# Patient Record
Sex: Male | Born: 1955 | Race: White | Hispanic: No | Marital: Married | Smoking: Former smoker
Health system: Southern US, Community
[De-identification: ages and names within clinical notes are randomized; demographics above are authoritative.]

## PROBLEM LIST (undated history)

## (undated) DIAGNOSIS — I82409 Acute embolism and thrombosis of unspecified deep veins of unspecified lower extremity: Secondary | ICD-10-CM

## (undated) DIAGNOSIS — I499 Cardiac arrhythmia, unspecified: Secondary | ICD-10-CM

## (undated) DIAGNOSIS — R002 Palpitations: Secondary | ICD-10-CM

## (undated) DIAGNOSIS — I10 Essential (primary) hypertension: Secondary | ICD-10-CM

## (undated) HISTORY — DX: Palpitations: R00.2

## (undated) HISTORY — DX: Essential (primary) hypertension: I10

## (undated) HISTORY — PX: NO PAST SURGERIES: SHX2092

## (undated) HISTORY — PX: OTHER SURGICAL HISTORY: SHX169

## (undated) HISTORY — PX: INNER EAR SURGERY: SHX679

---

## 1983-07-09 DIAGNOSIS — Z86718 Personal history of other venous thrombosis and embolism: Secondary | ICD-10-CM

## 1983-07-09 HISTORY — DX: Personal history of other venous thrombosis and embolism: Z86.718

## 2016-07-05 ENCOUNTER — Encounter: Payer: Self-pay | Admitting: *Deleted

## 2016-07-09 ENCOUNTER — Ambulatory Visit (INDEPENDENT_AMBULATORY_CARE_PROVIDER_SITE_OTHER): Payer: BC Managed Care – PPO | Admitting: Cardiovascular Disease

## 2016-07-09 ENCOUNTER — Encounter: Payer: Self-pay | Admitting: Cardiovascular Disease

## 2016-07-09 VITALS — BP 138/100 | HR 76 | Ht 72.0 in | Wt 237.0 lb

## 2016-07-09 DIAGNOSIS — F1010 Alcohol abuse, uncomplicated: Secondary | ICD-10-CM | POA: Diagnosis not present

## 2016-07-09 DIAGNOSIS — R002 Palpitations: Secondary | ICD-10-CM

## 2016-07-09 DIAGNOSIS — I10 Essential (primary) hypertension: Secondary | ICD-10-CM | POA: Diagnosis not present

## 2016-07-09 NOTE — Patient Instructions (Signed)
Medication Instructions:  Continue all current medications.  Labwork: none  Testing/Procedures:  Your physician has requested that you have an echocardiogram. Echocardiography is a painless test that uses sound waves to create images of your heart. It provides your doctor with information about the size and shape of your heart and how well your heart's chambers and valves are working. This procedure takes approximately one hour. There are no restrictions for this procedure.  Your physician has recommended that you wear a 30 day event monitor. Event monitors are medical devices that record the heart's electrical activity. Doctors most often us these monitors to diagnose arrhythmias. Arrhythmias are problems with the speed or rhythm of the heartbeat. The monitor is a small, portable device. You can wear one while you do your normal daily activities. This is usually used to diagnose what is causing palpitations/syncope (passing out).  Office will contact with results via phone or letter.    Follow-Up: 2 months   Any Other Special Instructions Will Be Listed Below (If Applicable).  If you need a refill on your cardiac medications before your next appointment, please call your pharmacy.  

## 2016-07-09 NOTE — Progress Notes (Signed)
CARDIOLOGY CONSULT NOTE  Patient ID: Jeffery Chambers MRN: [id number] DOB/AGE: [birth year] 61 y.o.  Admit date: (Not on file) Primary Physician: Jeffery Flavin, MD Referring Physician:   Reason for Consultation: palpitations  HPI: The patient is a 61 year old male who presents for the evaluation of palpitations. He has a history of hypertension.  ECG 04/30/16 showed normal sinus rhythm with no arrhythmias.  He began experiencing a tachycardia 2 years ago and said his blood pressure monitor at home showed that his heart rate was over 100 bpm. He then experienced heart racing approximately 18 months ago and was evaluated by a retired physician who asked about "an irregular heartbeat ". It lasted several hours. He may go one or two months without any problems and then will experience episodes lasting anywhere from 15 minutes to 5 or 6 hours. If he goes to bed with them when he awakes he is fine.  He denies associated shortness of breath, chest pain, lightheadedness, dizziness, and syncope. They are not associated with exertion.  Social history: He drinks 3-5 alcoholic beverages daily including vodka, bourbon, and beer.   Allergies  Allergen Reactions  . Codeine     Nausea     Current Outpatient Prescriptions  Medication Sig Dispense Refill  . Ascorbic Acid (VITAMIN C) 1000 MG tablet Take 1,000 mg by mouth daily.    Marland Kitchen lisinopril (PRINIVIL,ZESTRIL) 40 MG tablet Take 40 mg by mouth daily.    . Omega-3 Fatty Acids (FISH OIL) 1200 MG CAPS Take by mouth.    Marland Kitchen omeprazole (PRILOSEC OTC) 20 MG tablet Take 20 mg by mouth daily.    Marland Kitchen warfarin (COUMADIN) 10 MG tablet Take 10 mg by mouth every other day.     No current facility-administered medications for this visit.     Past Medical History:  Diagnosis Date  . Hypertension   . Palpitations     Past Surgical History:  Procedure Laterality Date  . NO PAST SURGERIES      Social History   Social History  . Marital status:  Married    Spouse name: N/A  . Number of children: N/A  . Years of education: N/A   Occupational History  . Not on file.   Social History Main Topics  . Smoking status: Former Smoker    Types: E-cigarettes    Quit date: 07/08/2008  . Smokeless tobacco: Never Used     Comment: vap   . Alcohol use Not on file  . Drug use: Unknown  . Sexual activity: Not on file   Other Topics Concern  . Not on file   Social History Narrative  . No narrative on file     Fam: Brother has an arrhythmia.  Prior to Admission medications   Medication Sig Start Date End Date Taking? Authorizing Provider  lisinopril (PRINIVIL,ZESTRIL) 40 MG tablet Take 40 mg by mouth daily.    Historical Provider, MD  omeprazole (PRILOSEC OTC) 20 MG tablet Take 20 mg by mouth daily.    Historical Provider, MD  warfarin (COUMADIN) 10 MG tablet Take 10 mg by mouth every other day.    Historical Provider, MD     Review of systems complete and found to be negative unless listed above in HPI     Physical exam Blood pressure (!) 138/100, pulse 76, height 6' (1.829 m), weight 237 lb (107.5 kg), SpO2 99 %. General: NAD Neck: No JVD, no thyromegaly or thyroid nodule.  Lungs: Clear to auscultation  bilaterally with normal respiratory effort. CV: Nondisplaced PMI. Regular rate and rhythm, normal S1/S2, no S3/S4, no murmur.  No peripheral edema.  No carotid bruit.  Abdomen: Obese.  Skin: Intact without lesions or rashes.  Neurologic: Alert and oriented x 3.  Psych: Normal affect. Extremities: No clubbing or cyanosis.  HEENT: Normal.   ECG: Most recent ECG reviewed.  Labs:  No results found for: WBC, HGB, HCT, MCV, PLT No results for input(s): NA, K, CL, CO2, BUN, CREATININE, CALCIUM, PROT, BILITOT, ALKPHOS, ALT, AST, GLUCOSE in the last 168 hours.  Invalid input(s): LABALBU No results found for: CKTOTAL, CKMB, CKMBINDEX, TROPONINI No results found for: CHOL No results found for: HDL No results found for:  LDLCALC No results found for: TRIG No results found for: CHOLHDL No results found for: LDLDIRECT       Studies: No results found.  ASSESSMENT AND PLAN:  1. Palpitations: I will order a 2-D echocardiogram with Doppler to evaluate cardiac structure, function, and regional wall motion. Will also obtain a 30-day event monitor as I am concerned about atrial fibrillation. He is already on warfarin.  2. Hypertension: Elevated. Will monitor.  3. Alcohol abuse: Counseled to reduce consumption as it can provoke both arrhythmias and lead to cardiac toxicity.  Dispo: fu 2 months   Signed: Prentice DockerSuresh Maelie Chambers, M.D., F.A.C.C.  07/09/2016, 1:46 PM

## 2016-07-10 ENCOUNTER — Other Ambulatory Visit: Payer: Self-pay | Admitting: Cardiovascular Disease

## 2016-07-10 DIAGNOSIS — R000 Tachycardia, unspecified: Secondary | ICD-10-CM

## 2016-07-16 ENCOUNTER — Ambulatory Visit (INDEPENDENT_AMBULATORY_CARE_PROVIDER_SITE_OTHER): Payer: BC Managed Care – PPO

## 2016-07-16 DIAGNOSIS — R002 Palpitations: Secondary | ICD-10-CM | POA: Diagnosis not present

## 2016-07-18 ENCOUNTER — Telehealth: Payer: Self-pay | Admitting: *Deleted

## 2016-07-18 NOTE — Telephone Encounter (Signed)
[facility] called says 160-180 HR - shows a-fib with RVR - 6 beat run of vtach - faxing over report to be scanned in - spoke with pt who denies amy symptoms or problems - says he did get excited/anxious for a breif time but denies any symptoms at this time - routed to provider

## 2016-07-18 NOTE — Telephone Encounter (Signed)
Will review rhythm strips and decide on management.

## 2016-07-22 MED ORDER — METOPROLOL SUCCINATE ER 25 MG PO TB24: 25.0000 mg | ORAL_TABLET | Freq: Every day | ORAL | 3 refills | Status: DC

## 2016-07-22 NOTE — Telephone Encounter (Signed)
Start Toprol XL 25 mg daily ? ? ?

## 2016-07-22 NOTE — Telephone Encounter (Signed)
Pt aware and voiced understanding - Medication sent to pharmacy.  

## 2016-08-01 ENCOUNTER — Ambulatory Visit (INDEPENDENT_AMBULATORY_CARE_PROVIDER_SITE_OTHER): Payer: BC Managed Care – PPO

## 2016-08-01 ENCOUNTER — Other Ambulatory Visit: Payer: Self-pay

## 2016-08-01 DIAGNOSIS — R000 Tachycardia, unspecified: Secondary | ICD-10-CM

## 2016-08-03 ENCOUNTER — Telehealth: Payer: Self-pay | Admitting: Physician Assistant

## 2016-08-03 NOTE — Telephone Encounter (Signed)
Paged by Forde RadonAJ from [facility] [phone] regarding rapid afib that started around 4:30. [facility] recommended patient to go to ED, he did not wish to according to AJ. I have called the patient, he is asymptomatic. No dizziness or feeling of passing out. I have instructed him and his wife to cut back lisinopril to 20mg  while increasing toprol to 25mg  BID. He will take 1 tablet now and recheck his HR in 2 hours. If he can maintain resting HR < 140 and stay asymptomatic, we can try to manage this as outpatient. If her HR still above 140 bpm in 2 hours, patient will contact after hour answering service again.  Ramond DialSigned, Neilani Duffee PA Pager: [phone]

## 2016-08-06 ENCOUNTER — Telehealth: Payer: Self-pay | Admitting: *Deleted

## 2016-08-06 NOTE — Telephone Encounter (Signed)
Notes Recorded by Lesle ChrisAngela G Mckena Chern, LPN on 9/56/21301/30/2018 at 5:32 PM EST Patient notified and verbalized understanding. Copy to pmd. ------  Notes Recorded by Laqueta LindenSuresh A Koneswaran, MD on 08/04/2016 at 10:47 PM EST Normal pumping function.

## 2016-08-13 ENCOUNTER — Ambulatory Visit (INDEPENDENT_AMBULATORY_CARE_PROVIDER_SITE_OTHER): Payer: BC Managed Care – PPO | Admitting: Cardiovascular Disease

## 2016-08-13 ENCOUNTER — Encounter: Payer: Self-pay | Admitting: Cardiovascular Disease

## 2016-08-13 VITALS — BP 148/100 | HR 68 | Ht 72.0 in | Wt 239.0 lb

## 2016-08-13 DIAGNOSIS — I4891 Unspecified atrial fibrillation: Secondary | ICD-10-CM | POA: Diagnosis not present

## 2016-08-13 DIAGNOSIS — I10 Essential (primary) hypertension: Secondary | ICD-10-CM

## 2016-08-13 DIAGNOSIS — F1010 Alcohol abuse, uncomplicated: Secondary | ICD-10-CM

## 2016-08-13 DIAGNOSIS — Z7189 Other specified counseling: Secondary | ICD-10-CM

## 2016-08-13 DIAGNOSIS — I82401 Acute embolism and thrombosis of unspecified deep veins of right lower extremity: Secondary | ICD-10-CM | POA: Diagnosis not present

## 2016-08-13 MED ORDER — LISINOPRIL 20 MG PO TABS: 20.0000 mg | ORAL_TABLET | Freq: Every day | ORAL | 3 refills | Status: DC

## 2016-08-13 MED ORDER — METOPROLOL SUCCINATE ER 25 MG PO TB24: 25.0000 mg | ORAL_TABLET | Freq: Two times a day (BID) | ORAL | 3 refills | Status: DC

## 2016-08-13 NOTE — Patient Instructions (Signed)
Medication Instructions:  Continue all current medications.  Labwork: none  Testing/Procedures: none  Follow-Up: 2 months   Any Other Special Instructions Will Be Listed Below (If Applicable).  If you need a refill on your cardiac medications before your next appointment, please call your pharmacy.  

## 2016-08-13 NOTE — Progress Notes (Signed)
SUBJECTIVE: The patient presents for follow-up of palpitations. He is wearing an event monitor which has demonstrated rapid atrial fibrillation with heart rates ranging from 160-180 bpm. He also had a 6 beat run of ventricular tachycardia. He tells me he was digging a 100 foot ditch which was 1 foot deep at the time. He was also Programme researcher, broadcasting/film/video wiring. He appears to get palpitations and rapid atrial fibrillation during periods of heavy exertion. He has run out of his metoprolol. It was increased from Toprol-XL 25 mg once daily to twice daily. 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  Echocardiogram 08/01/16: Normal left ventricular systolic function and regional wall motion, LVEF 60-65%. Mild mitral and tricuspid regurgitation. Left atrium at upper limits of normal in size.   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 Has several grandsons who play sports. He drinks 3-5 alcoholic beverages daily including vodka, bourbon, and beer.  Review of Systems: As per "subjective", otherwise negative.  Allergies  Allergen Reactions  . Codeine     Nausea     Current Outpatient Prescriptions  Medication Sig Dispense Refill  . Ascorbic Acid (VITAMIN C) 1000 MG tablet Take 1,000 mg by mouth daily.    Marland Kitchen lisinopril (PRINIVIL,ZESTRIL) 20 MG tablet Take 20 mg by mouth daily.    . metoprolol succinate (TOPROL-XL) 25 MG 24 hr tablet Take 25 mg by mouth 2 (two) times daily.    . Omega-3 Fatty Acids (FISH OIL) 1200 MG CAPS Take by mouth.    Marland Kitchen omeprazole (PRILOSEC OTC) 20 MG tablet Take 20 mg by mouth daily.    Marland Kitchen warfarin (COUMADIN) 10 MG tablet Take 10 mg by mouth every other day.     No current facility-administered medications for this visit.     Past Medical History:  Diagnosis Date  . Hypertension   . Palpitations     Past Surgical History:  Procedure Laterality  Date  . NO PAST SURGERIES      Social History   Social History  . Marital status: Married    Spouse name: N/A  . Number of children: N/A  . Years of education: N/A   Occupational History  . Not on file.   Social History Main Topics  . Smoking status: Former Smoker    Types: E-cigarettes    Quit date: 07/08/2008  . Smokeless tobacco: Never Used     Comment: vap   . Alcohol use Not on file  . Drug use: Unknown  . Sexual activity: Not on file   Other Topics Concern  . Not on file   Social History Narrative  . No narrative on file     Vitals:   08/13/16 1549  BP: (!) 148/100  Pulse: 68  SpO2: 98%  Weight: 239 lb (108.4 kg)  Height: 6' (1.829 m)    PHYSICAL EXAM General: NAD HEENT: Normal. Neck: No JVD, no thyromegaly. Lungs: Clear to auscultation bilaterally with normal respiratory effort. CV: Nondisplaced PMI.  Regular rate and rhythm, normal S1/S2, no S3/S4, no murmur. No pretibial or periankle edema.  No carotid bruit.   Abdomen: Soft, nontender, no distention.  Neurologic: Alert and oriented.  Psych: Normal affect. Skin: Normal. Musculoskeletal: No gross deformities.    ECG: Most recent ECG reviewed.      ASSESSMENT AND PLAN: 1. Paroxysmal atrial fibrillation: Continue Toprol-XL 25 mg twice  daily. This has appeared to suppress frequency of palpitations. 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 We discussed the option of taking Xarelto instead and he has had this discussion before with his PCP. For the present time, he prefers to stay on warfarin. Left atrium at upper limits of normal in size. Aim to control blood pressure. I encouraged alcohol consumption reduction as well.  2. Hypertension: Elevated but he has run out of metoprolol succinate. I have asked him to continue to monitor blood pressure to see if lisinopril dose has to be  increased back to 40 mg daily.  3. Alcohol abuse: Counseled to reduce consumption as it can provoke both arrhythmias and lead to cardiac toxicity.  4. 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 PCP monitors INR. We discussed the option of taking Xarelto instead and he has had this discussion before with his PCP. For the present time, he prefers to stay on warfarin.   Dispo: fu 2 months  Time spent: 40 minutes, of which greater than 50% was spent reviewing symptoms, relevant blood tests and studies, and discussing management plan with the patient.   Prentice DockerSuresh Koneswaran, M.D., F.A.C.C.

## 2016-08-22 ENCOUNTER — Telehealth: Payer: Self-pay | Admitting: *Deleted

## 2016-08-22 ENCOUNTER — Encounter: Payer: Self-pay | Admitting: *Deleted

## 2016-08-22 DIAGNOSIS — I472 Ventricular tachycardia: Secondary | ICD-10-CM

## 2016-08-22 DIAGNOSIS — I4729 Other ventricular tachycardia: Secondary | ICD-10-CM

## 2016-08-22 NOTE — Telephone Encounter (Signed)
Notes Recorded by Lesle ChrisAngela G Lillyen Schow, LPN on 1/61/09602/15/2018 at 5:12 PM EST Patient notified & agrees to do [place]. Copy to pmd. Will put order in & [facility]. ------  Notes Recorded by Laqueta LindenSuresh A Koneswaran, MD on 08/22/2016 at 12:37 PM EST Given multiple runs of NSVT, please obtain Lexiscan to rule out ischemic heart disease.

## 2016-09-02 ENCOUNTER — Encounter (HOSPITAL_COMMUNITY): Payer: Self-pay

## 2016-09-02 ENCOUNTER — Inpatient Hospital Stay (HOSPITAL_COMMUNITY): Admission: RE | Admit: 2016-09-02 | Payer: BC Managed Care – PPO | Source: Ambulatory Visit

## 2016-09-02 ENCOUNTER — Encounter (HOSPITAL_COMMUNITY)
Admission: RE | Admit: 2016-09-02 | Discharge: 2016-09-02 | Disposition: A | Discharge status: Home / Self Care | Payer: BC Managed Care – PPO | Source: Ambulatory Visit | Attending: Cardiovascular Disease | Admitting: Cardiovascular Disease

## 2016-09-02 ENCOUNTER — Telehealth: Payer: Self-pay | Admitting: *Deleted

## 2016-09-02 DIAGNOSIS — I472 Ventricular tachycardia: Secondary | ICD-10-CM | POA: Insufficient documentation

## 2016-09-02 DIAGNOSIS — I4729 Other ventricular tachycardia: Secondary | ICD-10-CM

## 2016-09-02 LAB — NM MYOCAR MULTI W/SPECT W/WALL MOTION / EF
CHL CUP RESTING HR STRESS: 60 {beats}/min
LV sys vol: 41 mL
LVDIAVOL: 113 mL (ref 62–150)
NUC STRESS TID: 1
Peak HR: 105 {beats}/min
RATE: 0.42
SDS: 1
SRS: 9
SSS: 10

## 2016-09-02 MED ORDER — TECHNETIUM TC 99M TETROFOSMIN IV KIT
10.0000 | PACK | Freq: Once | INTRAVENOUS | Status: AC | PRN
  Administered 2016-09-02: 11 via INTRAVENOUS

## 2016-09-02 MED ORDER — TECHNETIUM TC 99M TETROFOSMIN IV KIT
30.0000 | PACK | Freq: Once | INTRAVENOUS | Status: AC | PRN
  Administered 2016-09-02: 30.5 via INTRAVENOUS

## 2016-09-02 MED ORDER — SODIUM CHLORIDE 0.9% FLUSH
INTRAVENOUS | Status: AC
  Administered 2016-09-02: 10 mL via INTRAVENOUS
  Filled 2016-09-02: qty 10

## 2016-09-02 MED ORDER — REGADENOSON 0.4 MG/5ML IV SOLN
INTRAVENOUS | Status: AC
  Administered 2016-09-02: 0.4 mg via INTRAVENOUS
  Filled 2016-09-02: qty 5

## 2016-09-02 NOTE — Telephone Encounter (Signed)
Notes Recorded by Lesle ChrisAngela G Nikholas Geffre, LPN on 1/91/47822/26/2018 at 3:59 PM EST Patient notified. Copy to pmd. Follow up scheduled for 11/01/2016. ------  Notes Recorded by Laqueta LindenSuresh A Koneswaran, MD on 09/02/2016 at 3:26 PM EST Low risk.

## 2016-09-10 ENCOUNTER — Ambulatory Visit: Payer: Self-pay | Admitting: Cardiovascular Disease

## 2016-10-22 ENCOUNTER — Ambulatory Visit (INDEPENDENT_AMBULATORY_CARE_PROVIDER_SITE_OTHER): Payer: BC Managed Care – PPO | Admitting: Cardiovascular Disease

## 2016-10-22 VITALS — BP 179/92 | HR 62 | Ht 72.0 in | Wt 237.0 lb

## 2016-10-22 DIAGNOSIS — I82401 Acute embolism and thrombosis of unspecified deep veins of right lower extremity: Secondary | ICD-10-CM

## 2016-10-22 DIAGNOSIS — I10 Essential (primary) hypertension: Secondary | ICD-10-CM

## 2016-10-22 DIAGNOSIS — R002 Palpitations: Secondary | ICD-10-CM

## 2016-10-22 DIAGNOSIS — I4891 Unspecified atrial fibrillation: Secondary | ICD-10-CM

## 2016-10-22 DIAGNOSIS — I472 Ventricular tachycardia: Secondary | ICD-10-CM

## 2016-10-22 DIAGNOSIS — I4729 Other ventricular tachycardia: Secondary | ICD-10-CM

## 2016-10-22 MED ORDER — LISINOPRIL 40 MG PO TABS: 40.0000 mg | ORAL_TABLET | Freq: Every day | ORAL | 6 refills | Status: DC

## 2016-10-22 MED ORDER — METOPROLOL SUCCINATE ER 25 MG PO TB24: 25.0000 mg | ORAL_TABLET | Freq: Two times a day (BID) | ORAL | 6 refills | Status: DC

## 2016-10-22 NOTE — Progress Notes (Signed)
SUBJECTIVE: The patient presents for follow-up of paroxysmal atrial fibrillation. Event monitoring demonstrated multiple runs of nonsustained ventricular tachycardia. I then ordered a nuclear stress test to evaluate for ischemic heart disease which was low risk, LVEF 64%. 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  While he has had episodic palpitations, they have decreased in frequency and duration since Toprol-XL was increased to 25 mg twice daily. He has been checking his blood pressure and it has been running in the 140-150 systolic range during the daytime and with exertion.  He denies chest pain.   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 Has several grandsons who play sports. He drinks 3-5 alcoholic beverages daily including vodka, bourbon, and beer.    Review of Systems: As per "subjective", otherwise negative.  Allergies  Allergen Reactions  . Codeine     Nausea     Current Outpatient Prescriptions  Medication Sig Dispense Refill  . Ascorbic Acid (VITAMIN C) 1000 MG tablet Take 1,000 mg by mouth daily.    Marland Kitchen lisinopril (PRINIVIL,ZESTRIL) 20 MG tablet Take 1 tablet (20 mg total) by mouth daily. 90 tablet 3  . metoprolol succinate (TOPROL-XL) 25 MG 24 hr tablet Take 1 tablet (25 mg total) by mouth 2 (two) times daily. 180 tablet 3  . Omega-3 Fatty Acids (FISH OIL) 1200 MG CAPS Take by mouth.    Marland Kitchen omeprazole (PRILOSEC OTC) 20 MG tablet Take 20 mg by mouth daily.    Marland Kitchen warfarin (COUMADIN) 10 MG tablet Take 10 mg by mouth every other day.    Marland Kitchen omeprazole-sodium bicarbonate (ZEGERID) 40-1100 MG capsule      No current facility-administered medications for this visit.     Past Medical History:  Diagnosis Date  . Hypertension   . Palpitations     Past Surgical History:  Procedure Laterality Date  . NO PAST SURGERIES      Social History   Social History  . Marital  status: Married    Spouse name: N/A  . Number of children: N/A  . Years of education: N/A   Occupational History  . Not on file.   Social History Main Topics  . Smoking status: Former Smoker    Types: E-cigarettes    Quit date: 07/08/2008  . Smokeless tobacco: Never Used     Comment: vap   . Alcohol use Not on file  . Drug use: Unknown  . Sexual activity: Not on file   Other Topics Concern  . Not on file   Social History Narrative  . No narrative on file     Vitals:   10/22/16 1609  BP: (!) 179/92  Pulse: 62  SpO2: 98%  Weight: 237 lb (107.5 kg)  Height: 6' (1.829 m)    Wt Readings from Last 3 Encounters:  10/22/16 237 lb (107.5 kg)  08/13/16 239 lb (108.4 kg)  07/09/16 237 lb (107.5 kg)     PHYSICAL EXAM General: NAD HEENT: Normal. Neck: No JVD, no thyromegaly. Lungs: Clear to auscultation bilaterally with normal respiratory effort. CV: Nondisplaced PMI.  Regular rate and rhythm, normal S1/S2, no S3/S4, no murmur. No pretibial or periankle edema.  No carotid bruit.   Abdomen: Soft, nontender, no distention.  Neurologic: Alert and oriented.  Psych: Normal affect. Skin: Normal. Musculoskeletal: No gross deformities.    ECG: Most recent ECG reviewed.   Labs: No results found for:  K, BUN, CREATININE, ALT, TSH, HGB   Lipids: No results found for: LDLCALC, LDLDIRECT, CHOL, TRIG, HDL     ASSESSMENT AND PLAN:  1. Paroxysmal atrial fibrillation: He continues to have episodic palpitations but they have improved significantly on Toprol-XL 25 mg twice daily. 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 I previously talked about taking Xarelto rather than warfarin but he deferred. CHADSVASC score is 1 therefore he does not require anticoagulation for atrial fibrillation due to low thromboembolic risk. He can take an extra 12.5 mg of metoprolol prn for palpitations.  2. Hypertension: Markedly elevated. I will increase  lisinopril to 40 mg daily.  3. Alcohol abuse: I again counseled him on reduced consumption.  4. Recurrent right leg DVT/PE: He is maintained on warfarin and his PCP monitors his INR. I previously discussed taking Xarelto but he deferred.   Disposition: Follow up 6 months  Prentice Docker, M.D., F.A.C.C.

## 2016-10-22 NOTE — Patient Instructions (Signed)
Medication Instructions:   Increase Lisinopril to  daily.  May take an extra 1/2 tablet of your Toprol XL as needed for palpitations.  Continue all other medications.    Labwork: none  Testing/Procedures: none  Follow-Up: Your physician wants you to follow up in: 6 months.  You will receive a reminder letter in the mail one-two months in advance.  If you don't receive a letter, please call our office to schedule the follow up appointment   Any Other Special Instructions Will Be Listed Below (If Applicable).  If you need a refill on your cardiac medications before your next appointment, please call your pharmacy.

## 2017-04-29 ENCOUNTER — Other Ambulatory Visit: Payer: Self-pay | Admitting: Cardiovascular Disease

## 2017-05-30 ENCOUNTER — Other Ambulatory Visit: Payer: Self-pay | Admitting: Cardiovascular Disease

## 2017-07-31 ENCOUNTER — Other Ambulatory Visit: Payer: Self-pay | Admitting: Cardiovascular Disease

## 2017-09-15 ENCOUNTER — Ambulatory Visit: Payer: BC Managed Care – PPO | Admitting: Cardiovascular Disease

## 2017-09-15 ENCOUNTER — Other Ambulatory Visit: Payer: Self-pay

## 2017-09-15 ENCOUNTER — Encounter: Payer: Self-pay | Admitting: Cardiovascular Disease

## 2017-09-15 VITALS — BP 145/89 | HR 88 | Ht 72.0 in | Wt 234.0 lb

## 2017-09-15 DIAGNOSIS — I472 Ventricular tachycardia: Secondary | ICD-10-CM

## 2017-09-15 DIAGNOSIS — I10 Essential (primary) hypertension: Secondary | ICD-10-CM | POA: Diagnosis not present

## 2017-09-15 DIAGNOSIS — I82401 Acute embolism and thrombosis of unspecified deep veins of right lower extremity: Secondary | ICD-10-CM

## 2017-09-15 DIAGNOSIS — I480 Paroxysmal atrial fibrillation: Secondary | ICD-10-CM

## 2017-09-15 DIAGNOSIS — I4729 Other ventricular tachycardia: Secondary | ICD-10-CM

## 2017-09-15 MED ORDER — METOPROLOL SUCCINATE ER 25 MG PO TB24: ORAL_TABLET | ORAL | 11 refills | Status: DC

## 2017-09-15 NOTE — Patient Instructions (Signed)

## 2017-09-15 NOTE — Progress Notes (Signed)
SUBJECTIVE: Patient presents for follow-up of paroxysmal atrial fibrillation.  He did have multiple runs of nonsustained ventricular tachycardia on event monitoring last year along with multiple runs of rapid atrial fibrillation.  He underwent a low risk nuclear stress test on 09/02/16, LVEF 64%.  ECG performed in the office today which I ordered and personally interpreted demonstrates normal sinus rhythm with no ischemic ST segment or T-wave abnormalities, nor any arrhythmias.  The patient denies any symptoms of chest pain, palpitations, shortness of breath, lightheadedness, dizziness, leg swelling, orthopnea, PND, and syncope.  He is trying to lose some weight.  He said his blood pressure averages 134/83 at home.  He has had to take 5 or 6 half tablets of Toprol-XL for palpitations in the past 1 year.   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 Has several grandsons who play sports. He drinks 3-5 alcoholic beverages daily including vodka, bourbon, and beer.    Review of Systems: As per "subjective", otherwise negative.  Allergies  Allergen Reactions  . Codeine     Nausea     Current Outpatient Medications  Medication Sig Dispense Refill  . Ascorbic Acid (VITAMIN C) 1000 MG tablet Take 1,000 mg by mouth daily.    Marland Kitchen. lisinopril (PRINIVIL,ZESTRIL) 40 MG tablet TAKE ONE TABLET BY MOUTH EVERY DAY 30 tablet 1  . metoprolol succinate (TOPROL-XL) 25 MG 24 hr tablet TAKE ONE TABLET BY MOUTH TWICE DAILY (MAY TAKE ONE-HALF TABLET extra AS NEEDED FOR heart palpitations) 75 tablet 1  . Multiple Vitamins-Minerals (PRESERVISION AREDS 2 PO) Take by mouth.    . Omega-3 Fatty Acids (FISH OIL) 1200 MG CAPS Take by mouth.    Marland Kitchen. omeprazole (PRILOSEC OTC) 20 MG tablet Take 20 mg by mouth daily.    Marland Kitchen. omeprazole-sodium bicarbonate (ZEGERID) 40-1100 MG capsule     . warfarin (COUMADIN) 10 MG tablet Take 10 mg by mouth every other day.      No current facility-administered medications for this visit.     Past Medical History:  Diagnosis Date  . Hypertension   . Palpitations     Past Surgical History:  Procedure Laterality Date  . NO PAST SURGERIES      Social History   Socioeconomic History  . Marital status: Married    Spouse name: Not on file  . Number of children: Not on file  . Years of education: Not on file  . Highest education level: Not on file  Social Needs  . Financial resource strain: Not on file  . Food insecurity - worry: Not on file  . Food insecurity - inability: Not on file  . Transportation needs - medical: Not on file  . Transportation needs - non-medical: Not on file  Occupational History  . Not on file  Tobacco Use  . Smoking status: Former Smoker    Types: E-cigarettes    Last attempt to quit: 07/08/2008    Years since quitting: 9.1  . Smokeless tobacco: Never Used  . Tobacco comment: vap   Substance and Sexual Activity  . Alcohol use: Not on file  . Drug use: Not on file  . Sexual activity: Not on file  Other Topics Concern  . Not on file  Social History Narrative  . Not on file     Vitals:   09/15/17 1604  BP: (!) 145/89  Pulse: 88  SpO2: 98%  Weight: 234 lb (106.1 kg)  Height: 6' (1.829 m)    Wt Readings from Last 3 Encounters:  09/15/17 234 lb (106.1 kg)  10/22/16 237 lb (107.5 kg)  08/13/16 239 lb (108.4 kg)     PHYSICAL EXAM General: NAD HEENT: Normal. Neck: No JVD, no thyromegaly. Lungs: Clear to auscultation bilaterally with normal respiratory effort. CV: Regular rate and rhythm, normal S1/S2, no S3/S4, no murmur. No pretibial or periankle edema.  No carotid bruit.   Abdomen: Soft, nontender, no distention.  Neurologic: Alert and oriented.  Psych: Normal affect. Skin: Normal. Musculoskeletal: No gross deformities.    ECG: Most recent ECG reviewed.   Labs: No results found for: K, BUN, CREATININE, ALT, TSH, HGB   Lipids: No results found  for: LDLCALC, LDLDIRECT, CHOL, TRIG, HDL     ASSESSMENT AND PLAN: 1.  Paroxysmal atrial fibrillation: Symptomatically stable.  Continue Toprol-XL 25 mg twice daily. This has appeared to suppress frequency of palpitations. 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 We previously discussed the option of taking Xarelto instead and he has had this discussion before with his PCP. For the present time, he prefers to stay on warfarin. Left atrium at upper limits of normal in size.  2. Hypertension: Elevated in our office but he checks it routinely and it is been normal at home.  No changes to therapy.  3. Alcohol abuse: Previously counseled to reduce consumptionas it can provoke both arrhythmias and lead to cardiac toxicity.  4. 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 PCP monitors INR. We previously discussed the option of taking Xarelto instead and he has had this discussion before with his PCP. For the present time, he prefers to stay on warfarin.   5.  Nonsustained ventricular tachycardia: Symptomatically stable.  Nuclear stress test was low risk as detailed above.  Continue Toprol-XL.    Disposition: Follow up 1 year   Prentice Docker, M.D., F.A.C.C.

## 2017-09-29 ENCOUNTER — Other Ambulatory Visit: Payer: Self-pay | Admitting: Cardiovascular Disease

## 2018-07-02 ENCOUNTER — Ambulatory Visit: Payer: BC Managed Care – PPO | Admitting: Cardiology

## 2018-07-02 ENCOUNTER — Encounter: Payer: Self-pay | Admitting: Cardiology

## 2018-07-02 DIAGNOSIS — I10 Essential (primary) hypertension: Secondary | ICD-10-CM | POA: Diagnosis not present

## 2018-07-02 DIAGNOSIS — I4729 Other ventricular tachycardia: Secondary | ICD-10-CM | POA: Insufficient documentation

## 2018-07-02 DIAGNOSIS — I472 Ventricular tachycardia: Secondary | ICD-10-CM | POA: Insufficient documentation

## 2018-07-02 DIAGNOSIS — I480 Paroxysmal atrial fibrillation: Secondary | ICD-10-CM | POA: Diagnosis not present

## 2018-07-02 DIAGNOSIS — F1010 Alcohol abuse, uncomplicated: Secondary | ICD-10-CM | POA: Insufficient documentation

## 2018-07-02 DIAGNOSIS — Z86711 Personal history of pulmonary embolism: Secondary | ICD-10-CM | POA: Diagnosis not present

## 2018-07-02 MED ORDER — CARVEDILOL 12.5 MG PO TABS: 12.5000 mg | ORAL_TABLET | Freq: Two times a day (BID) | ORAL | 11 refills | Status: DC

## 2018-07-02 MED ORDER — HYDROCHLOROTHIAZIDE 25 MG PO TABS: 12.5000 mg | ORAL_TABLET | Freq: Every day | ORAL | 3 refills | Status: DC

## 2018-07-02 NOTE — Assessment & Plan Note (Signed)
Patient has a history of daily EtOH, 2 drinks a day

## 2018-07-02 NOTE — Assessment & Plan Note (Signed)
CHADS VASC=1 (on Coumadin for PE, not PAF) Normal sinus rhythm today on exam

## 2018-07-02 NOTE — Patient Instructions (Signed)
Medication Instructions:  STOP Benicar  STOP Toprol   START HCTZ 12. 5 mg daily   START Coreg 12.5 mg twice a day   If you need a refill on your cardiac medications before your next appointment, please call your pharmacy.   Lab work: NONE today If you have labs (blood work) drawn today and your tests are completely normal, you will receive your results only by: Marland Kitchen. MyChart Message (if you have MyChart) OR . A paper copy in the mail If you have any lab test that is abnormal or we need to change your treatment, we will call you to review the results.  Testing/Procedures: None today  Follow-Up: At [facility], you and your health needs are our priority.  As part of our continuing mission to provide you with exceptional heart care, we have created designated Provider Care Teams.  These Care Teams include your primary Cardiologist (physician) and Advanced Practice Providers (APPs -  Physician Assistants and Nurse Practitioners) who all work together to provide you with the care you need, when you need it. You will need a follow up appointment in 2 weeks.  Please call our office 2 months in advance to schedule this appointment.  You may see luke Kilroy PA-C or one of the following Advanced Practice Providers on your designated Care Team:   [place] Strader, PA-C [facility]([place] Office) . Jacolyn ReedyMichele Lenze, PA-C [facility]([place] Office)  Any Other Special Instructions Will Be Listed Below (If Applicable). NONE

## 2018-07-02 NOTE — Assessment & Plan Note (Signed)
Noted on Holter in jan 2018. Echo -normal LVF Jan 2018. Low risk Myoview Feb 2018

## 2018-07-02 NOTE — Assessment & Plan Note (Signed)
On chronic Coumadin- PCP follows

## 2018-07-02 NOTE — Assessment & Plan Note (Signed)
Patient is seen in the office today with recent onset uncontrolled hypertension

## 2018-07-02 NOTE — Progress Notes (Signed)
07/02/2018 Jeffery MinerJeffery B Douds Sr.   Feb 18, 1956  [id number]  Primary Physician Selinda FlavinHoward, Kevin, MD Primary Cardiologist: Dr Purvis SheffieldKoneswaran  HPI: Jeffery Chambers is a 62 year old male who was seen today in the office with recent onset of poorly controlled hypertension.  He was [place] recently with high blood pressure.  He denies any unusual chest pain shortness of breath or tachycardia.  He was placed on amlodipine but had lower extremity edema.  His Toprol was increased to 100 mg twice a day.  Olmesartan was added to lisinopril for his hypertension.  He tells me that his pressure has been better, though in the office today it is 150/90 he is not happy with his current medication regimen, he says he feels poorly since his medications were adjusted.  He is stopped taking the amlodipine because of lower extremity edema.  He was put on Zoloft as well by his PCP and tells me that he feels wiped out all the time.   Current Outpatient Medications  Medication Sig Dispense Refill  . Ascorbic Acid (VITAMIN C) 1000 MG tablet Take 1,000 mg by mouth daily.    Marland Kitchen. lisinopril (PRINIVIL,ZESTRIL) 40 MG tablet TAKE ONE TABLET BY MOUTH EVERY DAY 90 tablet 3  . Multiple Vitamins-Minerals (PRESERVISION AREDS 2 PO) Take by mouth.    . Omega-3 Fatty Acids (FISH OIL) 1200 MG CAPS Take by mouth.    Marland Kitchen. omeprazole (PRILOSEC OTC) 20 MG tablet Take 20 mg by mouth daily.    . sertraline (ZOLOFT) 50 MG tablet Take 50 mg by mouth daily.    Marland Kitchen. warfarin (COUMADIN) 10 MG tablet Take 10 mg by mouth every other day.    . carvedilol (COREG) 12.5 MG tablet Take 1 tablet (12.5 mg total) by mouth 2 (two) times daily. 60 tablet 11  . hydrochlorothiazide (HYDRODIURIL) 25 MG tablet Take 0.5 tablets (12.5 mg total) by mouth daily. 45 tablet 3   No current facility-administered medications for this visit.     Allergies  Allergen Reactions  . Codeine     Nausea     Past Medical History:  Diagnosis Date  . Hypertension     . Palpitations     Social History   Socioeconomic History  . Marital status: Married    Spouse name: Not on file  . Number of children: Not on file  . Years of education: Not on file  . Highest education level: Not on file  Occupational History  . Not on file  Social Needs  . Financial resource strain: Not on file  . Food insecurity:    Worry: Not on file    Inability: Not on file  . Transportation needs:    Medical: Not on file    Non-medical: Not on file  Tobacco Use  . Smoking status: Former Smoker    Types: E-cigarettes    Last attempt to quit: 07/08/2008    Years since quitting: 9.9  . Smokeless tobacco: Never Used  . Tobacco comment: vap   Substance and Sexual Activity  . Alcohol use: Not on file  . Drug use: Not on file  . Sexual activity: Not on file  Lifestyle  . Physical activity:    Days per week: Not on file    Minutes per session: Not on file  . Stress: Not on file  Relationships  . Social connections:    Talks on phone: Not on file    Gets together: Not on file  Attends religious service: Not on file    Active member of club or organization: Not on file    Attends meetings of clubs or organizations: Not on file    Relationship status: Not on file  . Intimate partner violence:    Fear of current or ex partner: Not on file    Emotionally abused: Not on file    Physically abused: Not on file    Forced sexual activity: Not on file  Other Topics Concern  . Not on file  Social History Narrative  . Not on file     Family History  Problem Relation Age of Onset  . Heart disease Father   . Arrhythmia Brother   . CVA Paternal Grandfather      Review of Systems: General: negative for chills, fever, night sweats or weight changes.  Cardiovascular: negative for chest pain, dyspnea on exertion, edema, orthopnea, palpitations, paroxysmal nocturnal dyspnea or shortness of breath Dermatological: negative for rash Respiratory: negative for cough or  wheezing Urologic: negative for hematuria Abdominal: negative for nausea, vomiting, diarrhea, bright red blood per rectum, melena, or hematemesis Neurologic: negative for visual changes, syncope, or dizziness All other systems reviewed and are otherwise negative except as noted above.    Blood pressure (!) 160/100, pulse 74, height 6' (1.829 m), weight 243 lb 12.8 oz (110.6 kg), SpO2 98 %.  General appearance: alert, cooperative and no distress Neck: no carotid bruit and no JVD Lungs: clear to auscultation bilaterally Heart: regular rate and rhythm Abdomen: soft, non-tender; bowel sounds normal; no masses,  no organomegaly and no abdominal bruit Extremities: no edema Skin: Skin color, texture, turgor normal. No rashes or lesions Neurologic: Grossly normal   ASSESSMENT AND PLAN:   Essential hypertension Patient is seen in the office today with recent onset uncontrolled hypertension  PAF (paroxysmal atrial fibrillation) (HCC) CHADS VASC=1 (on Coumadin for PE, not PAF) Normal sinus rhythm today on exam  History of pulmonary embolus (PE) On chronic Coumadin- PCP follows  H/O NSVT Noted on Holter in jan 2018. Echo -normal LVF Jan 2018. Low risk Myoview Feb 2018  ETOH abuse Patient has a history of daily EtOH, 2 drinks a day   PLAN  I suggested we change his Toprol 100 mg twice daily to carvedilol 12.5 mg twice daily, stop his olmesartan, add HCTZ 12.5 mg daily, and see him back in a couple weeks for blood pressure follow-up.  He will need a BMP and we should also document an EKG on that visit.  I suggested he discuss Zoloft with his PCP.  I also let him know that chronic alcohol use may have some effect on his heart at some point in the future.  Jeffery Chambers noted that her husbands father died from an aortic aneurysm and wondered if her husband should be "checked". I told her I would defer that to the pt's primary cardiologist.   Corine ShelterLuke Merilee Wible PA-C 07/02/2018 1:35 PM

## 2018-07-16 ENCOUNTER — Ambulatory Visit: Payer: BC Managed Care – PPO | Admitting: Cardiology

## 2018-07-20 ENCOUNTER — Ambulatory Visit: Payer: BC Managed Care – PPO | Admitting: Cardiology

## 2018-07-23 ENCOUNTER — Encounter: Payer: Self-pay | Admitting: Cardiology

## 2018-07-23 ENCOUNTER — Ambulatory Visit: Payer: BC Managed Care – PPO | Admitting: Cardiology

## 2018-07-23 VITALS — BP 122/88 | HR 62 | Ht 72.0 in | Wt 239.0 lb

## 2018-07-23 DIAGNOSIS — I480 Paroxysmal atrial fibrillation: Secondary | ICD-10-CM | POA: Diagnosis not present

## 2018-07-23 DIAGNOSIS — I10 Essential (primary) hypertension: Secondary | ICD-10-CM

## 2018-07-23 NOTE — Progress Notes (Signed)
07/23/2018 Jeffery MinerJeffery B Darrow Sr.   June 02, 1956  [id number]  Primary Physician Selinda FlavinHoward, Kevin, MD Primary Cardiologist: Dr Purvis SheffieldKoneswaran  HPI: Jeffery Chambers is a 63 year old male with a history of PAF and nonsustained VT in the past.  A prior nuclear stress test in 2018 was low risk with normal LV function.  He is also had prior remote DVT and pulmonary embolism.  He has hypertension and was seen in the office recently to have his medications adjusted. He felt terrible on Toprol and Olmesartan.  On 07/02/2018 I suggested we change his Toprol 100 mg twice a day to carvedilol 12.5 mg twice a day.  I stopped his olmesartan and added HCTZ 12.5 mg a day.  He is here today for follow-up.  He tells me he feels "great".  No fatigue.  His B/P runs in the 120's systolic and 80's diastolic.  He has a watch that monitors his B/P at home.  It's not accurate -(runs high) but he has adjusted readings according to actually B/P readings.    Current Outpatient Medications  Medication Sig Dispense Refill  . Ascorbic Acid (VITAMIN C) 1000 MG tablet Take 1,000 mg by mouth daily.    . carvedilol (COREG) 12.5 MG tablet Take 1 tablet (12.5 mg total) by mouth 2 (two) times daily. 60 tablet 11  . hydrochlorothiazide (HYDRODIURIL) 25 MG tablet Take 0.5 tablets (12.5 mg total) by mouth daily. 45 tablet 3  . lisinopril (PRINIVIL,ZESTRIL) 40 MG tablet TAKE ONE TABLET BY MOUTH EVERY DAY 90 tablet 3  . Multiple Vitamins-Minerals (PRESERVISION AREDS 2 PO) Take by mouth.    . Omega-3 Fatty Acids (FISH OIL) 1200 MG CAPS Take by mouth.    Marland Kitchen. omeprazole (PRILOSEC OTC) 20 MG tablet Take 20 mg by mouth daily.    . sertraline (ZOLOFT) 50 MG tablet Take 25 mg by mouth at bedtime.    Marland Kitchen. warfarin (COUMADIN) 10 MG tablet Take 10 mg by mouth every other day.     No current facility-administered medications for this visit.     Allergies  Allergen Reactions  . Codeine     Nausea     Past Medical History:  Diagnosis Date  . Hypertension    . Palpitations     Social History   Socioeconomic History  . Marital status: Married    Spouse name: Not on file  . Number of children: Not on file  . Years of education: Not on file  . Highest education level: Not on file  Occupational History  . Not on file  Social Needs  . Financial resource strain: Not on file  . Food insecurity:    Worry: Not on file    Inability: Not on file  . Transportation needs:    Medical: Not on file    Non-medical: Not on file  Tobacco Use  . Smoking status: Former Smoker    Types: E-cigarettes    Last attempt to quit: 07/08/2008    Years since quitting: 10.0  . Smokeless tobacco: Never Used  . Tobacco comment: vap   Substance and Sexual Activity  . Alcohol use: Not on file  . Drug use: Not on file  . Sexual activity: Not on file  Lifestyle  . Physical activity:    Days per week: Not on file    Minutes per session: Not on file  . Stress: Not on file  Relationships  . Social connections:    Talks on phone: Not on file  Gets together: Not on file    Attends religious service: Not on file    Active member of club or organization: Not on file    Attends meetings of clubs or organizations: Not on file    Relationship status: Not on file  . Intimate partner violence:    Fear of current or ex partner: Not on file    Emotionally abused: Not on file    Physically abused: Not on file    Forced sexual activity: Not on file  Other Topics Concern  . Not on file  Social History Narrative  . Not on file     Family History  Problem Relation Age of Onset  . Heart disease Father   . Arrhythmia Brother   . CVA Paternal Grandfather      Review of Systems: General: negative for chills, fever, night sweats or weight changes.  Cardiovascular: negative for chest pain, dyspnea on exertion, edema, orthopnea, palpitations, paroxysmal nocturnal dyspnea or shortness of breath Dermatological: negative for rash Respiratory: negative for cough or  wheezing Urologic: negative for hematuria Abdominal: negative for nausea, vomiting, diarrhea, bright red blood per rectum, melena, or hematemesis Neurologic: negative for visual changes, syncope, or dizziness HOH All other systems reviewed and are otherwise negative except as noted above.    There were no vitals taken for this visit.  General appearance: alert, cooperative and no distress Lungs: clear to auscultation bilaterally Heart: regular rate and rhythm Extremities: no edema Neurologic: Grossly normal  EKG NSR, sinus arrythmia  ASSESSMENT AND PLAN:   Essential hypertension Patient is seen in the office today as a follow up of recent medication changes- doing well  PAF (paroxysmal atrial fibrillation) (HCC) CHADS VASC=1 (on Coumadin for PE, not PAF) Normal sinus rhythm today on exam  History of pulmonary embolus (PE) On chronic Coumadin- PCP follows  H/O NSVT Noted on Holter in jan 2018. Echo -normal LVF Jan 2018. Low risk Myoview Feb 2018  ETOH abuse Patient has a history of daily EtOH, 2 drinks a day   PLAN  Check BMP on HCTZ and ACE- keep f/u with Dr Purvis Sheffield in [place] in March.   Corine Shelter PA-C 07/23/2018 4:08 PM

## 2018-07-23 NOTE — Patient Instructions (Addendum)
Medication Instructions:  Your physician recommends that you continue on your current medications as directed. Please refer to the Current Medication list given to you today. If you need a refill on your cardiac medications before your next appointment, please call your pharmacy.   Lab work: Your physician recommends that you return for lab work in: TODAY-BMET If you have labs (blood work) drawn today and your tests are completely normal, you will receive your results only by: Marland Kitchen MyChart Message (if you have MyChart) OR . A paper copy in the mail If you have any lab test that is abnormal or we need to change your treatment, we will call you to review the results.  Testing/Procedures: NONE   Follow-Up: At [facility], you and your health needs are our priority.  As part of our continuing mission to provide you with exceptional heart care, we have created designated Provider Care Teams.  These Care Teams include your primary Cardiologist (physician) and Advanced Practice Providers (APPs -  Physician Assistants and Nurse Practitioners) who all work together to provide you with the care you need, when you need it. Marland Kitchen LUKE RECOMMENDS YOU FOLLOW UP WITH DR Dorma Russell AS SCHEDULED  Any Other Special Instructions Will Be Listed Below (If Applicable).

## 2018-07-24 LAB — BASIC METABOLIC PANEL
BUN/Creatinine Ratio: 39 — ABNORMAL HIGH (ref 10–24)
BUN: 44 mg/dL — ABNORMAL HIGH (ref 8–27)
CO2: 16 mmol/L — ABNORMAL LOW (ref 20–29)
Calcium: 10.4 mg/dL — ABNORMAL HIGH (ref 8.6–10.2)
Chloride: 98 mmol/L (ref 96–106)
Creatinine, Ser: 1.14 mg/dL (ref 0.76–1.27)
GFR calc Af Amer: 80 mL/min/{1.73_m2} (ref >59)
GFR calc non Af Amer: 69 mL/min/{1.73_m2} (ref >59)
Glucose: 95 mg/dL (ref 65–99)
Potassium: 4.7 mmol/L (ref 3.5–5.2)
Sodium: 133 mmol/L — ABNORMAL LOW (ref 134–144)

## 2018-07-27 ENCOUNTER — Telehealth: Payer: Self-pay | Admitting: *Deleted

## 2018-07-27 DIAGNOSIS — I10 Essential (primary) hypertension: Secondary | ICD-10-CM

## 2018-07-27 MED ORDER — HYDROCHLOROTHIAZIDE 25 MG PO TABS: ORAL_TABLET | ORAL | 3 refills | Status: DC

## 2018-07-27 NOTE — Telephone Encounter (Addendum)
Left message for pt to call   ----- Message from Abelino Derrick, PA-C sent at 07/26/2018 11:01 AM EST ----- Please tell the patient his labs indicate he may be mildly dehydrated- I would decrease his HCTZ to MWF and recheck a BMP in two weeks.  Corine Shelter PA-C 07/26/2018 11:01 AM

## 2018-07-27 NOTE — Telephone Encounter (Signed)
Spoke with pt, he voiced understanding of new medications instructions. Lab orders mailed to the pt

## 2018-07-31 IMAGING — NM NM MYOCAR MULTI W/SPECT W/WALL MOTION & EF
2 series · 12 of 12 positions shown · non-contrast
Comparison: none

[Series 1: rest · 8.28mm/px · 6 of 64 frames shown]
[frame 6/64]
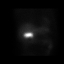
[frame 16/64]
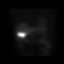
[frame 27/64]
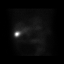
[frame 38/64]
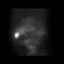
[frame 48/64]
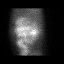
[frame 59/64]
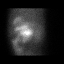

[Series 2: stress gated · 8.28mm/px · 6 of 64 frames shown]
[frame 6/64]
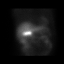
[frame 16/64]
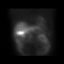
[frame 27/64]
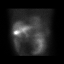
[frame 38/64]
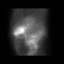
[frame 48/64]
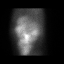
[frame 59/64]
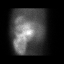

[12 of 12 positions shown; findings below may reference images not displayed]

Canned report from images found in remote index.

Refer to host system for actual result text.

## 2018-08-15 LAB — BASIC METABOLIC PANEL
BUN/Creatinine Ratio: 19 (ref 10–24)
BUN: 19 mg/dL (ref 8–27)
CO2: 20 mmol/L (ref 20–29)
Calcium: 9.6 mg/dL (ref 8.6–10.2)
Chloride: 102 mmol/L (ref 96–106)
Creatinine, Ser: 1.02 mg/dL (ref 0.76–1.27)
GFR calc Af Amer: 91 mL/min/{1.73_m2} (ref >59)
GFR calc non Af Amer: 78 mL/min/{1.73_m2} (ref >59)
Glucose: 99 mg/dL (ref 65–99)
Potassium: 4.3 mmol/L (ref 3.5–5.2)
Sodium: 140 mmol/L (ref 134–144)

## 2018-09-21 ENCOUNTER — Other Ambulatory Visit: Payer: Self-pay | Admitting: Cardiovascular Disease

## 2018-12-21 ENCOUNTER — Other Ambulatory Visit: Payer: Self-pay | Admitting: Cardiovascular Disease

## 2019-03-21 ENCOUNTER — Other Ambulatory Visit: Payer: Self-pay | Admitting: Cardiovascular Disease

## 2019-05-19 ENCOUNTER — Other Ambulatory Visit: Payer: Self-pay | Admitting: Cardiology

## 2019-06-20 ENCOUNTER — Other Ambulatory Visit: Payer: Self-pay | Admitting: Cardiology

## 2019-06-20 ENCOUNTER — Other Ambulatory Visit: Payer: Self-pay | Admitting: Cardiovascular Disease

## 2019-07-19 ENCOUNTER — Other Ambulatory Visit: Payer: Self-pay | Admitting: Cardiology

## 2019-07-28 ENCOUNTER — Other Ambulatory Visit: Payer: Self-pay | Admitting: Cardiology

## 2019-08-20 ENCOUNTER — Encounter: Payer: Self-pay | Admitting: Cardiovascular Disease

## 2019-08-20 ENCOUNTER — Ambulatory Visit (INDEPENDENT_AMBULATORY_CARE_PROVIDER_SITE_OTHER): Payer: BC Managed Care – PPO | Admitting: Cardiovascular Disease

## 2019-08-20 ENCOUNTER — Telehealth: Payer: Self-pay | Admitting: *Deleted

## 2019-08-20 VITALS — BP 133/88 | HR 88 | Ht 72.0 in | Wt 245.0 lb

## 2019-08-20 DIAGNOSIS — I10 Essential (primary) hypertension: Secondary | ICD-10-CM

## 2019-08-20 DIAGNOSIS — I472 Ventricular tachycardia: Secondary | ICD-10-CM

## 2019-08-20 DIAGNOSIS — Z86711 Personal history of pulmonary embolism: Secondary | ICD-10-CM

## 2019-08-20 DIAGNOSIS — I4729 Other ventricular tachycardia: Secondary | ICD-10-CM

## 2019-08-20 DIAGNOSIS — I480 Paroxysmal atrial fibrillation: Secondary | ICD-10-CM

## 2019-08-20 NOTE — Telephone Encounter (Signed)
The patient verbally consented for a telehealth phone visit with [facility] and understands that his/her insurance company will be billed for the encounter.  

## 2019-08-20 NOTE — Progress Notes (Signed)
Virtual Visit via Telephone Note   This visit type was conducted due to national recommendations for restrictions regarding the COVID-19 Pandemic (e.g. social distancing) in an effort to limit this patient's exposure and mitigate transmission in our community.  Due to his co-morbid illnesses, this patient is at least at moderate risk for complications without adequate follow up.  This format is felt to be most appropriate for this patient at this time.  The patient did not have access to video technology/had technical difficulties with video requiring transitioning to audio format only (telephone).  All issues noted in this document were discussed and addressed.  No physical exam could be performed with this format.  Please refer to the patient's chart for his  consent to telehealth for [facility]   Date:  08/20/2019   ID:  Jeffery Islam Sr., DOB Jun 22, 1956, MRN [id number]  Patient Location: Home Provider Location: Office  PCP:  Rory Percy, MD  Cardiologist:  No primary care provider on file.  Electrophysiologist:  None   Evaluation Performed:  Follow-Up Visit  Chief Complaint:  PAF  History of Present Illness:    Jeffery Purdy. is a 64 y.o. male with paroxysmal atrial fibrillation. He did have multiple runs of nonsustained ventricular tachycardia on event monitoring in 2018 along with multiple runs of rapid atrial fibrillation.  He underwent a low risk nuclear stress test on 09/02/16, LVEF 64%.  The patient denies any symptoms of chest pain, palpitations, shortness of breath, lightheadedness, dizziness, leg swelling, orthopnea, PND, and syncope.  He just finished exercising.  He has still been doing some teaching and doing driver's education.   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 Married. Has several grandsons who play sports. He drinks 3-5 alcoholic beverages daily including vodka, bourbon, and  beer.  Past Medical History:  Diagnosis Date  . Hypertension   . Palpitations    Past Surgical History:  Procedure Laterality Date  . NO PAST SURGERIES       Current Meds  Medication Sig  . Ascorbic Acid (VITAMIN C) 1000 MG tablet Take 1,000 mg by mouth daily.  . carvedilol (COREG) 12.5 MG tablet TAKE 1 TABLET BY MOUTH TWICE DAILY  . hydrochlorothiazide (HYDRODIURIL) 25 MG tablet TAKE 1/2 TABLET BY MOUTH ON MONDAY, WEDNESDAY, AND FRIDAY  . lisinopril (ZESTRIL) 40 MG tablet TAKE 1 TABLET BY MOUTH DAILY  . Multiple Vitamins-Minerals (PRESERVISION AREDS 2 PO) Take 1 tablet by mouth in the morning and at bedtime.   . Omega-3 Fatty Acids (FISH OIL) 1200 MG CAPS Take 2 capsules by mouth every morning.   Marland Kitchen omeprazole (PRILOSEC OTC) 20 MG tablet Take 20 mg by mouth daily.  Marland Kitchen warfarin (COUMADIN) 10 MG tablet Take 10 mg by mouth every other day. PCP DOSING     Allergies:   Codeine   Social History   Tobacco Use  . Smoking status: Former Smoker    Types: E-cigarettes    Quit date: 07/08/2008    Years since quitting: 11.1  . Smokeless tobacco: Never Used  . Tobacco comment: vap   Substance Use Topics  . Alcohol use: Not on file  . Drug use: Not on file     Family Hx: The patient's family history includes Arrhythmia in his brother; CVA in his paternal grandfather; Heart disease in his father.  ROS:   Please see the history of present illness.     All other systems reviewed and  are negative.   Prior CV studies:   The following studies were reviewed today:  Reviewed above  Labs/Other Tests and Data Reviewed:    EKG:  No ECG reviewed.  Recent Labs: No results found for requested labs within last 8760 hours.   Recent Lipid Panel No results found for: CHOL, TRIG, HDL, CHOLHDL, LDLCALC, LDLDIRECT  Wt Readings from Last 3 Encounters:  08/20/19 245 lb (111.1 kg)  07/23/18 239 lb (108.4 kg)  07/02/18 243 lb 12.8 oz (110.6 kg)     Objective:    Vital Signs:  BP 133/88    Pulse 88   Ht 6' (1.829 m)   Wt 245 lb (111.1 kg)   BMI 33.23 kg/m    VITAL SIGNS:  reviewed  ASSESSMENT & PLAN:    1.  Paroxysmal atrial fibrillation: Symptomatically stable on carvedilol.  Currently on warfarin for anticoagulation.  He previously declined DOAC.  2.  Hypertension: Diastolic BP is mildly elevated but he just finished exercising.  No changes.   3.  History of recurrent right leg DVT/pulmonary embolism: Maintained on warfarin and PCP monitors INR. He previously declined DOAC.  4.  NSVT: No recurrences since event monitoring in 2018.    COVID-19 Education: The signs and symptoms of COVID-19 were discussed with the patient and how to seek care for testing (follow up with PCP or arrange E-visit).  The importance of social distancing was discussed today.  Time:   Today, I have spent 10 minutes with the patient with telehealth technology discussing the above problems.     Medication Adjustments/Labs and Tests Ordered: Current medicines are reviewed at length with the patient today.  Concerns regarding medicines are outlined above.   Tests Ordered: No orders of the defined types were placed in this encounter.   Medication Changes: No orders of the defined types were placed in this encounter.   Follow Up:  In Person in 1 year(s)  Signed, Prentice Docker, MD  08/20/2019 1:38 PM    [facility]

## 2019-08-20 NOTE — Patient Instructions (Signed)

## 2019-08-26 ENCOUNTER — Other Ambulatory Visit: Payer: Self-pay | Admitting: Cardiology

## 2019-09-05 ENCOUNTER — Ambulatory Visit: Payer: BC Managed Care – PPO | Attending: Internal Medicine

## 2019-09-05 DIAGNOSIS — Z23 Encounter for immunization: Secondary | ICD-10-CM

## 2019-09-05 NOTE — Progress Notes (Signed)
   [id number]  Name:  Jeffery WAGER Sr.    MRN: [id number] DOB: May 22, 1956  09/05/2019  [name] was observed post Covid-19 immunization for 15 minutes without incidence. He was provided with Vaccine Information Sheet and instruction to access the V-Safe system.   [name] was instructed to call 911 with any severe reactions post vaccine: Marland Kitchen Difficulty breathing  . Swelling of your face and throat  . A fast heartbeat  . A bad rash all over your body  . Dizziness and weakness    Immunizations Administered    Name Date Dose VIS Date Route   Moderna COVID-19 Vaccine 09/05/2019 12:40 PM 0.5 mL 06/08/2019 Intramuscular   Manufacturer: Moderna   Lot: [id number]   NDC: 82608-883-58

## 2019-09-07 ENCOUNTER — Ambulatory Visit: Payer: BC Managed Care – PPO | Admitting: Cardiovascular Disease

## 2019-09-27 ENCOUNTER — Other Ambulatory Visit: Payer: Self-pay | Admitting: Cardiology

## 2019-10-09 ENCOUNTER — Ambulatory Visit: Payer: BC Managed Care – PPO | Attending: Internal Medicine

## 2019-10-09 DIAGNOSIS — Z23 Encounter for immunization: Secondary | ICD-10-CM

## 2019-10-09 NOTE — Progress Notes (Signed)
   [id number]  Name:  Jeffery HOLLARS Sr.    MRN: [id number] DOB: [birth year]  10/09/2019  Mr. Jeffery Chambers was observed post Covid-19 immunization for 15 minutes without incident. He was provided with Vaccine Information Sheet and instruction to access the V-Safe system.   Mr. Jeffery Chambers was instructed to call 911 with any severe reactions post vaccine: Marland Kitchen Difficulty breathing  . Swelling of face and throat  . A fast heartbeat  . A bad rash all over body  . Dizziness and weakness   Immunizations Administered    Name Date Dose VIS Date Route   Moderna COVID-19 Vaccine 10/09/2019  9:28 AM 0.5 mL 06/08/2019 Intramuscular   Manufacturer: Moderna   Lot: [id number]   NDC: 43154-008-67

## 2020-06-18 NOTE — Progress Notes (Signed)
Cardiology Office Note  Date: 06/19/2020   ID: Jeffery Miner Sr., DOB Dec 24, 1955, MRN [id number]  PCP:  Selinda Flavin, MD  Cardiologist:  No primary care provider on file. Electrophysiologist:  None   Chief Complaint: Follow-up PAF  History of Present Illness: Jeffery Wender. is a 64 y.o. male with a history of PAF, HTN, recurrent DVT/PE.  Last encounter with Dr. Purvis Sheffield 08/20/2019.  Previously had multiple runs of NSVT on event monitor 2018 with multiple runs of rapid atrial fibrillation.  Low risk stress test 09/02/2016, LVEF 64%.  Denies any lightheadedness, dizziness, chest pain, palpitations, shortness of breath, edema, orthopnea, PND, or syncope.  PAF was symptomatically stable on carvedilol.  Currently on warfarin for stroke prophylaxis and history of DVT/PE.  No recurrence of NSVT.   Patient is here for 49-month follow-up.  States has been doing well without any acute illnesses or hospitalizations.  States he plans to go play golf today.  Denies any issues with palpitations or arrhythmias since he started taking the carvedilol.  Blood pressure is elevated today on arrival at 144/98.  Patient states he is taking none of his antihypertensive medication as of yet today.  States his blood pressure recently at primary care office was 112 systolic.  He denies any anginal or exertional symptoms, orthostatic symptoms, CVA or TIA-like symptoms, PND/orthopnea, denies bleeding issues on Coumadin.  His Coumadin is managed by his PCP.  He denies any claudication-like symptoms, DVT or PE-like symptoms. No significant lower extremity edema.  Past Medical History:  Diagnosis Date  . Hypertension   . Palpitations     Past Surgical History:  Procedure Laterality Date  . NO PAST SURGERIES      Current Outpatient Medications  Medication Sig Dispense Refill  . Ascorbic Acid (VITAMIN C) 1000 MG tablet Take 1,000 mg by mouth daily.    . carvedilol (COREG) 12.5 MG tablet TAKE 1  TABLET BY MOUTH TWICE DAILY 180 tablet 3  . hydrochlorothiazide (HYDRODIURIL) 25 MG tablet TAKE 1/2 TABLET BY MOUTH ON MONDAY, WEDNESDAY, AND FRIDAY 45 tablet 3  . lisinopril (ZESTRIL) 40 MG tablet TAKE 1 TABLET BY MOUTH DAILY 90 tablet 3  . Multiple Vitamins-Minerals (PRESERVISION AREDS 2 PO) Take 1 tablet by mouth in the morning and at bedtime.     . Omega-3 Fatty Acids (FISH OIL) 1200 MG CAPS Take 2 capsules by mouth every morning.     Marland Kitchen omeprazole (PRILOSEC OTC) 20 MG tablet Take 20 mg by mouth daily.    Marland Kitchen warfarin (COUMADIN) 10 MG tablet Take 10 mg by mouth every other day. PCP DOSING     No current facility-administered medications for this visit.   Allergies:  Codeine   Social History: The patient  reports that he quit smoking about 11 years ago. His smoking use included e-cigarettes. He has never used smokeless tobacco.   Family History: The patient's family history includes Arrhythmia in his brother; CVA in his paternal grandfather; Heart disease in his father.   ROS:  Please see the history of present illness. Otherwise, complete review of systems is positive for none.  All other systems are reviewed and negative.   Physical Exam: VS:  BP (!) 144/98   Pulse 71   Ht 5\' 11"  (1.803 m)   Wt 239 lb (108.4 kg)   SpO2 95%   BMI 33.33 kg/m , BMI Body mass index is 33.33 kg/m.  Wt Readings from Last 3 Encounters:  06/19/20 239 lb (108.4  kg)  08/20/19 245 lb (111.1 kg)  07/23/18 239 lb (108.4 kg)    General: Patient appears comfortable at rest. Neck: Supple, no elevated JVP or carotid bruits, no thyromegaly. Lungs: Clear to auscultation, nonlabored breathing at rest. Cardiac: Regular rate and rhythm, no S3 or significant systolic murmur, no pericardial rub. Extremities: No pitting edema, distal pulses 2+. Skin: Warm and dry. Musculoskeletal: No kyphosis. Neuropsychiatric: Alert and oriented x3, affect grossly appropriate.  ECG:  An ECG dated 06/19/2020 was personally  reviewed today and demonstrated:  Normal sinus rhythm rate of 66, septal infarct, age undetermined  Recent Labwork: No results found for requested labs within last 8760 hours.  No results found for: CHOL, TRIG, HDL, CHOLHDL, VLDL, LDLCALC, LDLDIRECT  Other Studies Reviewed Today:  Echocardiogram 08/01/2016 Study Conclusions   - Left ventricle: The cavity size was mildly dilated. Wall  thickness was normal. Systolic function was normal. The estimated  ejection fraction was in the range of 60% to 65%. Wall motion was  normal; there were no regional wall motion abnormalities. There  was a borderline abnormality in the ratio of early to atrial left  ventricular filling.  - Aortic valve: Mildly calcified annulus. Trileaflet; mildly  thickened, mildly calcified leaflets.  - Mitral valve: Mildly calcified annulus. Normal thickness leaflets  . There was mild regurgitation.  - Tricuspid valve: There was mild regurgitation.  - Pulmonary arteries: PA peak pressure: 36 mm Hg (S).   Impressions:   - The right ventricular systolic pressure was increased consistent  with mild pulmonary hypertension.     Cardiac monitor 07/17/2016 Study Highlights   Multiple runs of rapid atrial fibrillation and non-sustained ventricular tachycardia (longest 21 beats).    Assessment and Plan:  1. Paroxysmal atrial fibrillation (HCC)   2. Essential hypertension   3. H/O NSVT   4. History of recurrent deep vein thrombosis (DVT)     1. Paroxysmal atrial fibrillation (HCC) Denies any palpitations or arrhythmias.  He has not had any more palpitations or irregular heartbeats per his statement since starting to take the carvedilol.  2. Essential hypertension Blood pressure is elevated today on arrival at 144/98.  He has not taken any of his antihypertensive medications yet today.  States his blood pressure at recent PCP visit was 112 systolic.  Continue carvedilol 12.5 mg p.o. twice daily.   Continue hydrochlorothiazide 12.5 mg daily.  Continue lisinopril 40 mg daily.  3. H/O NSVT No recent palpitations or arrhythmias.  EKG today shows normal sinus rhythm with a rate of 66.  4. History of recurrent deep vein thrombosis (DVT)/PE Continues on Coumadin 10 mg every other day.  Managed by PCP.  No bleeding on Coumadin  Medication Adjustments/Labs and Tests Ordered: Current medicines are reviewed at length with the patient today.  Concerns regarding medicines are outlined above.   Disposition: Follow-up with Dr. Diona Browner or APP 1 year.  Signed, Rennis Harding, NP 06/19/2020 8:03 AM    [facility] at [facility] [address] [place], [place], [place] [address] Phone: [phone]; Fax: [phone]

## 2020-06-19 ENCOUNTER — Ambulatory Visit: Payer: BC Managed Care – PPO | Admitting: Family Medicine

## 2020-06-19 ENCOUNTER — Encounter: Payer: Self-pay | Admitting: Family Medicine

## 2020-06-19 ENCOUNTER — Other Ambulatory Visit: Payer: Self-pay

## 2020-06-19 VITALS — BP 144/98 | HR 71 | Ht 71.0 in | Wt 239.0 lb

## 2020-06-19 DIAGNOSIS — I10 Essential (primary) hypertension: Secondary | ICD-10-CM

## 2020-06-19 DIAGNOSIS — I472 Ventricular tachycardia: Secondary | ICD-10-CM

## 2020-06-19 DIAGNOSIS — I4729 Other ventricular tachycardia: Secondary | ICD-10-CM

## 2020-06-19 DIAGNOSIS — I480 Paroxysmal atrial fibrillation: Secondary | ICD-10-CM | POA: Diagnosis not present

## 2020-06-19 DIAGNOSIS — Z86718 Personal history of other venous thrombosis and embolism: Secondary | ICD-10-CM | POA: Diagnosis not present

## 2020-06-19 NOTE — Patient Instructions (Addendum)
Your physician wants you to follow-up in: 1 YEAR WITH MD You will receive a reminder letter in the mail two months in advance. If you don't receive a letter, please call our office to schedule the follow-up appointment.  Your physician recommends that you continue on your current medications as directed. Please refer to the Current Medication list given to you today.  Thank you for choosing [facility]!!   

## 2020-07-13 ENCOUNTER — Other Ambulatory Visit: Payer: Self-pay | Admitting: Cardiology

## 2020-10-10 ENCOUNTER — Other Ambulatory Visit: Payer: Self-pay | Admitting: Family Medicine

## 2021-07-10 ENCOUNTER — Other Ambulatory Visit: Payer: Self-pay | Admitting: Cardiology

## 2021-07-12 DIAGNOSIS — I10 Essential (primary) hypertension: Secondary | ICD-10-CM | POA: Diagnosis not present

## 2021-07-12 DIAGNOSIS — Z6834 Body mass index (BMI) 34.0-34.9, adult: Secondary | ICD-10-CM | POA: Diagnosis not present

## 2021-07-12 DIAGNOSIS — T148XXA Other injury of unspecified body region, initial encounter: Secondary | ICD-10-CM | POA: Diagnosis not present

## 2021-07-12 DIAGNOSIS — I4891 Unspecified atrial fibrillation: Secondary | ICD-10-CM | POA: Diagnosis not present

## 2021-07-12 DIAGNOSIS — Z7901 Long term (current) use of anticoagulants: Secondary | ICD-10-CM | POA: Diagnosis not present

## 2021-07-16 DIAGNOSIS — I10 Essential (primary) hypertension: Secondary | ICD-10-CM | POA: Diagnosis not present

## 2021-07-16 DIAGNOSIS — Z6834 Body mass index (BMI) 34.0-34.9, adult: Secondary | ICD-10-CM | POA: Diagnosis not present

## 2021-07-16 DIAGNOSIS — T148XXA Other injury of unspecified body region, initial encounter: Secondary | ICD-10-CM | POA: Diagnosis not present

## 2021-07-16 DIAGNOSIS — Z7901 Long term (current) use of anticoagulants: Secondary | ICD-10-CM | POA: Diagnosis not present

## 2021-07-16 DIAGNOSIS — I4891 Unspecified atrial fibrillation: Secondary | ICD-10-CM | POA: Diagnosis not present

## 2021-07-18 DIAGNOSIS — I4891 Unspecified atrial fibrillation: Secondary | ICD-10-CM | POA: Diagnosis not present

## 2021-07-18 DIAGNOSIS — Z7901 Long term (current) use of anticoagulants: Secondary | ICD-10-CM | POA: Diagnosis not present

## 2021-07-20 DIAGNOSIS — I4891 Unspecified atrial fibrillation: Secondary | ICD-10-CM | POA: Diagnosis not present

## 2021-07-20 DIAGNOSIS — Z7901 Long term (current) use of anticoagulants: Secondary | ICD-10-CM | POA: Diagnosis not present

## 2021-07-24 DIAGNOSIS — I4891 Unspecified atrial fibrillation: Secondary | ICD-10-CM | POA: Diagnosis not present

## 2021-07-31 DIAGNOSIS — I4891 Unspecified atrial fibrillation: Secondary | ICD-10-CM | POA: Diagnosis not present

## 2021-08-14 DIAGNOSIS — I749 Embolism and thrombosis of unspecified artery: Secondary | ICD-10-CM | POA: Diagnosis not present

## 2021-08-14 DIAGNOSIS — I4891 Unspecified atrial fibrillation: Secondary | ICD-10-CM | POA: Diagnosis not present

## 2021-09-13 DIAGNOSIS — I4891 Unspecified atrial fibrillation: Secondary | ICD-10-CM | POA: Diagnosis not present

## 2021-09-17 DIAGNOSIS — I749 Embolism and thrombosis of unspecified artery: Secondary | ICD-10-CM | POA: Diagnosis not present

## 2021-09-17 DIAGNOSIS — I4891 Unspecified atrial fibrillation: Secondary | ICD-10-CM | POA: Diagnosis not present

## 2021-09-24 DIAGNOSIS — I4891 Unspecified atrial fibrillation: Secondary | ICD-10-CM | POA: Diagnosis not present

## 2021-10-01 DIAGNOSIS — I4891 Unspecified atrial fibrillation: Secondary | ICD-10-CM | POA: Diagnosis not present

## 2021-10-08 ENCOUNTER — Other Ambulatory Visit: Payer: Self-pay | Admitting: Student

## 2021-10-15 ENCOUNTER — Other Ambulatory Visit: Payer: Self-pay | Admitting: Cardiology

## 2021-10-16 DIAGNOSIS — I10 Essential (primary) hypertension: Secondary | ICD-10-CM | POA: Diagnosis not present

## 2021-10-16 DIAGNOSIS — I82409 Acute embolism and thrombosis of unspecified deep veins of unspecified lower extremity: Secondary | ICD-10-CM | POA: Diagnosis not present

## 2021-10-16 DIAGNOSIS — I4891 Unspecified atrial fibrillation: Secondary | ICD-10-CM | POA: Diagnosis not present

## 2021-10-30 DIAGNOSIS — I4891 Unspecified atrial fibrillation: Secondary | ICD-10-CM | POA: Diagnosis not present

## 2021-11-05 ENCOUNTER — Other Ambulatory Visit: Payer: Self-pay | Admitting: Student

## 2021-11-13 DIAGNOSIS — I4891 Unspecified atrial fibrillation: Secondary | ICD-10-CM | POA: Diagnosis not present

## 2021-11-13 DIAGNOSIS — I749 Embolism and thrombosis of unspecified artery: Secondary | ICD-10-CM | POA: Diagnosis not present

## 2021-12-11 DIAGNOSIS — Z7901 Long term (current) use of anticoagulants: Secondary | ICD-10-CM | POA: Diagnosis not present

## 2021-12-11 DIAGNOSIS — I4891 Unspecified atrial fibrillation: Secondary | ICD-10-CM | POA: Diagnosis not present

## 2021-12-11 DIAGNOSIS — I749 Embolism and thrombosis of unspecified artery: Secondary | ICD-10-CM | POA: Diagnosis not present

## 2022-01-10 DIAGNOSIS — I749 Embolism and thrombosis of unspecified artery: Secondary | ICD-10-CM | POA: Diagnosis not present

## 2022-01-10 DIAGNOSIS — I4891 Unspecified atrial fibrillation: Secondary | ICD-10-CM | POA: Diagnosis not present

## 2022-01-15 DIAGNOSIS — Z0000 Encounter for general adult medical examination without abnormal findings: Secondary | ICD-10-CM | POA: Diagnosis not present

## 2022-01-15 DIAGNOSIS — I10 Essential (primary) hypertension: Secondary | ICD-10-CM | POA: Diagnosis not present

## 2022-01-15 DIAGNOSIS — Z23 Encounter for immunization: Secondary | ICD-10-CM | POA: Diagnosis not present

## 2022-01-15 DIAGNOSIS — I4891 Unspecified atrial fibrillation: Secondary | ICD-10-CM | POA: Diagnosis not present

## 2022-01-15 DIAGNOSIS — E7849 Other hyperlipidemia: Secondary | ICD-10-CM | POA: Diagnosis not present

## 2022-01-15 DIAGNOSIS — Z6834 Body mass index (BMI) 34.0-34.9, adult: Secondary | ICD-10-CM | POA: Diagnosis not present

## 2022-01-28 ENCOUNTER — Other Ambulatory Visit: Payer: Self-pay | Admitting: Cardiology

## 2022-02-04 DIAGNOSIS — H04123 Dry eye syndrome of bilateral lacrimal glands: Secondary | ICD-10-CM | POA: Diagnosis not present

## 2022-02-04 DIAGNOSIS — H5203 Hypermetropia, bilateral: Secondary | ICD-10-CM | POA: Diagnosis not present

## 2022-02-04 DIAGNOSIS — H35033 Hypertensive retinopathy, bilateral: Secondary | ICD-10-CM | POA: Diagnosis not present

## 2022-02-04 DIAGNOSIS — H2513 Age-related nuclear cataract, bilateral: Secondary | ICD-10-CM | POA: Diagnosis not present

## 2022-07-13 ENCOUNTER — Other Ambulatory Visit: Payer: Self-pay | Admitting: Student

## 2022-07-13 DIAGNOSIS — J069 Acute upper respiratory infection, unspecified: Secondary | ICD-10-CM | POA: Diagnosis not present

## 2022-07-13 DIAGNOSIS — R059 Cough, unspecified: Secondary | ICD-10-CM | POA: Diagnosis not present

## 2022-07-13 DIAGNOSIS — Z6834 Body mass index (BMI) 34.0-34.9, adult: Secondary | ICD-10-CM | POA: Diagnosis not present

## 2022-07-13 DIAGNOSIS — R030 Elevated blood-pressure reading, without diagnosis of hypertension: Secondary | ICD-10-CM | POA: Diagnosis not present

## 2022-10-03 DIAGNOSIS — Z1283 Encounter for screening for malignant neoplasm of skin: Secondary | ICD-10-CM | POA: Diagnosis not present

## 2022-10-03 DIAGNOSIS — L570 Actinic keratosis: Secondary | ICD-10-CM | POA: Diagnosis not present

## 2022-10-03 DIAGNOSIS — L400 Psoriasis vulgaris: Secondary | ICD-10-CM | POA: Diagnosis not present

## 2022-10-03 DIAGNOSIS — L568 Other specified acute skin changes due to ultraviolet radiation: Secondary | ICD-10-CM | POA: Diagnosis not present

## 2022-10-12 ENCOUNTER — Other Ambulatory Visit: Payer: Self-pay | Admitting: Student

## 2022-10-25 ENCOUNTER — Ambulatory Visit: Payer: PPO | Attending: Internal Medicine | Admitting: Internal Medicine

## 2022-10-25 ENCOUNTER — Encounter: Payer: Self-pay | Admitting: Internal Medicine

## 2022-10-25 VITALS — BP 136/84 | HR 79 | Ht 72.0 in | Wt 252.2 lb

## 2022-10-25 DIAGNOSIS — I10 Essential (primary) hypertension: Secondary | ICD-10-CM | POA: Diagnosis not present

## 2022-10-25 MED ORDER — CARVEDILOL 12.5 MG PO TABS: 12.5000 mg | ORAL_TABLET | Freq: Two times a day (BID) | ORAL | 3 refills | Status: DC

## 2022-10-25 NOTE — Progress Notes (Signed)
Cardiology Office Note  Date: 10/25/2022   ID: Jeffery Miner Sr., DOB 1955/11/16, MRN [id number]  PCP:  Jeffery Flavin, MD  Cardiologist:  Jeffery Bicker, MD Electrophysiologist:  None   Reason for Office Visit: Follow-up of paroxysmal A-fib   History of Present Illness: Jeffery Chambers. is a 67 y.o. male known to have paroxysmal A-fib, HTN, recurrent DVT/PE on Coumadin (refused DOAC) is here for follow-up visit.  Patient was initially referred to cardiology clinic for palpitations. Cardiac event monitor in 2018 showed multiple runs of nonsustained V. tach along with multiple runs of rapid atrial fibrillation. He was placed on carvedilol with significant improvement in his symptoms.  He is here for follow-up visit.  Denied any palpitations, had 1 episode of palpitations in the last 1 year.  Drinks alcohol in the weekend with his friends (not all the time, sometimes).  Denies angina or DOE.  Denies syncope, dizziness or leg swelling.  Past Medical History:  Diagnosis Date   Hypertension    Palpitations     Past Surgical History:  Procedure Laterality Date   NO PAST SURGERIES      Current Outpatient Medications  Medication Sig Dispense Refill   Ascorbic Acid (VITAMIN C) 1000 MG tablet Take 1,000 mg by mouth daily.     carvedilol (COREG) 12.5 MG tablet Take 1 tablet (12.5 mg total) by mouth in the morning and at bedtime. 60 tablet 0   hydrochlorothiazide (HYDRODIURIL) 25 MG tablet TAKE 1/2 TABLET BY MOUTH THREE TIMES A WEEK ON MONDAY, WEDNESDAY, AND FRIDAY - needs APPOINTMENT FOR MORE REFILLS 15 tablet 0   lisinopril (ZESTRIL) 40 MG tablet Take 1 tablet (40 mg total) by mouth daily. 30 tablet 0   Multiple Vitamins-Minerals (PRESERVISION AREDS 2 PO) Take 1 tablet by mouth in the morning and at bedtime.      Omega-3 Fatty Acids (FISH OIL) 1200 MG CAPS Take 2 capsules by mouth every morning.      omeprazole (PRILOSEC OTC) 20 MG tablet Take 20 mg by mouth daily.      warfarin (COUMADIN) 10 MG tablet Take 10 mg by mouth daily. PCP DOSING     No current facility-administered medications for this visit.   Allergies:  Codeine   Social History: The patient  reports that he quit smoking about 14 years ago. His smoking use included e-cigarettes. He has never used smokeless tobacco.   Family History: The patient's family history includes Arrhythmia in his brother; CVA in his paternal grandfather; Heart disease in his father.   ROS:  Please see the history of present illness. Otherwise, complete review of systems is positive for none  All other systems are reviewed and negative.   Physical Exam: VS:  BP 136/84   Pulse 79   Ht 6' (1.829 m)   Wt 252 lb 3.2 oz (114.4 kg)   SpO2 97%   BMI 34.20 kg/m , BMI Body mass index is 34.2 kg/m.  Wt Readings from Last 3 Encounters:  10/25/22 252 lb 3.2 oz (114.4 kg)  06/19/20 239 lb (108.4 kg)  08/20/19 245 lb (111.1 kg)    General: Patient appears comfortable at rest. HEENT: Conjunctiva and lids normal, oropharynx clear with moist mucosa. Neck: Supple, no elevated JVP or carotid bruits, no thyromegaly. Lungs: Clear to auscultation, nonlabored breathing at rest. Cardiac: Regular rate and rhythm, no S3 or significant systolic murmur, no pericardial rub. Abdomen: Soft, nontender, no hepatomegaly, bowel sounds present, no guarding or rebound.  Extremities: No pitting edema, distal pulses 2+. Skin: Warm and dry. Musculoskeletal: No kyphosis. Neuropsychiatric: Alert and oriented x3, affect grossly appropriate.  Recent Labwork: No results found for requested labs within last 365 days.  No results found for: "CHOL", "TRIG", "HDL", "CHOLHDL", "VLDL", "LDLCALC", "LDLDIRECT"  Other Studies Reviewed Today:   Assessment and Plan: Patient is a 67 year old M known to have paroxysmal A-fib, HTN, recurrent DVT on Coumadin is here for follow-up visit.  # Paroxysmal A-fib: Asymptomatic, continue carvedilol 12.5 mg twice  daily, continue Coumadin (PCP monitors INR).  INR goal between 2 and 3. # Multiple runs of NSVT on cardiac event monitor in 2018: EKG today showed normal sinus rhythm, no PVCs.  Continue carvedilol 12.5 mg twice daily. # Recurrent DVT on Coumadin: Continue Coumadin, follow-up with PCP, INR goal between 2 and 3.  Previously refused DOAC. # HTN, controlled: Continue HCTZ 25 mg (3 times a week), lisinopril 40 mg once daily and continue carvedilol 12.5 mg twice daily. # Alcohol abuse: Drinks 4-5 bottles of alcohol in the weekends, educated to cut down to prevent A-fib with RVR.   I have spent a total of 30 minutes with patient reviewing chart, EKGs, labs and examining patient as well as establishing an assessment and plan that was discussed with the patient.  > 50% of time was spent in direct patient care.    Medication Adjustments/Labs and Tests Ordered: Current medicines are reviewed at length with the patient today.  Concerns regarding medicines are outlined above.   Tests Ordered: Orders Placed This Encounter  Procedures   EKG 12-Lead    Medication Changes: No orders of the defined types were placed in this encounter.   Disposition:  Follow up  1 year  Signed Jeffery Wellbrock Verne Spurr, MD, 10/25/2022 8:42 AM    [facility] at [facility] [address] [place], [place], [place] [address]

## 2022-10-25 NOTE — Patient Instructions (Addendum)

## 2022-11-22 DIAGNOSIS — R030 Elevated blood-pressure reading, without diagnosis of hypertension: Secondary | ICD-10-CM | POA: Diagnosis not present

## 2022-11-22 DIAGNOSIS — I4891 Unspecified atrial fibrillation: Secondary | ICD-10-CM | POA: Diagnosis not present

## 2022-12-13 ENCOUNTER — Other Ambulatory Visit: Payer: Self-pay | Admitting: Internal Medicine

## 2022-12-20 DIAGNOSIS — I4891 Unspecified atrial fibrillation: Secondary | ICD-10-CM | POA: Diagnosis not present

## 2022-12-20 DIAGNOSIS — R030 Elevated blood-pressure reading, without diagnosis of hypertension: Secondary | ICD-10-CM | POA: Diagnosis not present

## 2022-12-26 DIAGNOSIS — R030 Elevated blood-pressure reading, without diagnosis of hypertension: Secondary | ICD-10-CM | POA: Diagnosis not present

## 2022-12-26 DIAGNOSIS — I4891 Unspecified atrial fibrillation: Secondary | ICD-10-CM | POA: Diagnosis not present

## 2023-02-07 DIAGNOSIS — I4891 Unspecified atrial fibrillation: Secondary | ICD-10-CM | POA: Diagnosis not present

## 2023-02-07 DIAGNOSIS — R030 Elevated blood-pressure reading, without diagnosis of hypertension: Secondary | ICD-10-CM | POA: Diagnosis not present

## 2023-02-27 DIAGNOSIS — H04123 Dry eye syndrome of bilateral lacrimal glands: Secondary | ICD-10-CM | POA: Diagnosis not present

## 2023-02-27 DIAGNOSIS — H5203 Hypermetropia, bilateral: Secondary | ICD-10-CM | POA: Diagnosis not present

## 2023-02-27 DIAGNOSIS — H35033 Hypertensive retinopathy, bilateral: Secondary | ICD-10-CM | POA: Diagnosis not present

## 2023-02-27 DIAGNOSIS — H2513 Age-related nuclear cataract, bilateral: Secondary | ICD-10-CM | POA: Diagnosis not present

## 2023-04-15 DIAGNOSIS — L570 Actinic keratosis: Secondary | ICD-10-CM | POA: Diagnosis not present

## 2023-04-15 DIAGNOSIS — B078 Other viral warts: Secondary | ICD-10-CM | POA: Diagnosis not present

## 2023-04-15 DIAGNOSIS — L400 Psoriasis vulgaris: Secondary | ICD-10-CM | POA: Diagnosis not present

## 2023-04-15 DIAGNOSIS — D485 Neoplasm of uncertain behavior of skin: Secondary | ICD-10-CM | POA: Diagnosis not present

## 2023-04-15 DIAGNOSIS — L568 Other specified acute skin changes due to ultraviolet radiation: Secondary | ICD-10-CM | POA: Diagnosis not present

## 2023-04-29 DIAGNOSIS — B078 Other viral warts: Secondary | ICD-10-CM | POA: Diagnosis not present

## 2023-04-29 DIAGNOSIS — L308 Other specified dermatitis: Secondary | ICD-10-CM | POA: Diagnosis not present

## 2023-04-29 DIAGNOSIS — L400 Psoriasis vulgaris: Secondary | ICD-10-CM | POA: Diagnosis not present

## 2023-04-29 DIAGNOSIS — L538 Other specified erythematous conditions: Secondary | ICD-10-CM | POA: Diagnosis not present

## 2023-04-29 DIAGNOSIS — R208 Other disturbances of skin sensation: Secondary | ICD-10-CM | POA: Diagnosis not present

## 2023-04-29 DIAGNOSIS — L570 Actinic keratosis: Secondary | ICD-10-CM | POA: Diagnosis not present

## 2023-06-03 DIAGNOSIS — Z23 Encounter for immunization: Secondary | ICD-10-CM | POA: Diagnosis not present

## 2023-06-03 DIAGNOSIS — E7800 Pure hypercholesterolemia, unspecified: Secondary | ICD-10-CM | POA: Diagnosis not present

## 2023-06-03 DIAGNOSIS — Z6835 Body mass index (BMI) 35.0-35.9, adult: Secondary | ICD-10-CM | POA: Diagnosis not present

## 2023-06-03 DIAGNOSIS — I4891 Unspecified atrial fibrillation: Secondary | ICD-10-CM | POA: Diagnosis not present

## 2023-06-03 DIAGNOSIS — E7801 Familial hypercholesterolemia: Secondary | ICD-10-CM | POA: Diagnosis not present

## 2023-06-03 DIAGNOSIS — I10 Essential (primary) hypertension: Secondary | ICD-10-CM | POA: Diagnosis not present

## 2023-06-12 DIAGNOSIS — I4891 Unspecified atrial fibrillation: Secondary | ICD-10-CM | POA: Diagnosis not present

## 2023-06-19 DIAGNOSIS — R030 Elevated blood-pressure reading, without diagnosis of hypertension: Secondary | ICD-10-CM | POA: Diagnosis not present

## 2023-06-19 DIAGNOSIS — I4891 Unspecified atrial fibrillation: Secondary | ICD-10-CM | POA: Diagnosis not present

## 2023-06-26 DIAGNOSIS — Z7901 Long term (current) use of anticoagulants: Secondary | ICD-10-CM | POA: Diagnosis not present

## 2023-06-26 DIAGNOSIS — I4891 Unspecified atrial fibrillation: Secondary | ICD-10-CM | POA: Diagnosis not present

## 2023-06-26 DIAGNOSIS — R030 Elevated blood-pressure reading, without diagnosis of hypertension: Secondary | ICD-10-CM | POA: Diagnosis not present

## 2023-08-14 DIAGNOSIS — Z7901 Long term (current) use of anticoagulants: Secondary | ICD-10-CM | POA: Diagnosis not present

## 2023-09-02 DIAGNOSIS — Z7901 Long term (current) use of anticoagulants: Secondary | ICD-10-CM | POA: Diagnosis not present

## 2023-09-02 DIAGNOSIS — R030 Elevated blood-pressure reading, without diagnosis of hypertension: Secondary | ICD-10-CM | POA: Diagnosis not present

## 2023-09-10 DIAGNOSIS — L538 Other specified erythematous conditions: Secondary | ICD-10-CM | POA: Diagnosis not present

## 2023-09-10 DIAGNOSIS — L568 Other specified acute skin changes due to ultraviolet radiation: Secondary | ICD-10-CM | POA: Diagnosis not present

## 2023-09-10 DIAGNOSIS — L570 Actinic keratosis: Secondary | ICD-10-CM | POA: Diagnosis not present

## 2023-09-10 DIAGNOSIS — R208 Other disturbances of skin sensation: Secondary | ICD-10-CM | POA: Diagnosis not present

## 2023-09-10 DIAGNOSIS — B078 Other viral warts: Secondary | ICD-10-CM | POA: Diagnosis not present

## 2023-10-07 DIAGNOSIS — Z7901 Long term (current) use of anticoagulants: Secondary | ICD-10-CM | POA: Diagnosis not present

## 2023-10-16 DIAGNOSIS — B078 Other viral warts: Secondary | ICD-10-CM | POA: Diagnosis not present

## 2023-10-16 DIAGNOSIS — L568 Other specified acute skin changes due to ultraviolet radiation: Secondary | ICD-10-CM | POA: Diagnosis not present

## 2023-10-16 DIAGNOSIS — R208 Other disturbances of skin sensation: Secondary | ICD-10-CM | POA: Diagnosis not present

## 2023-10-16 DIAGNOSIS — L538 Other specified erythematous conditions: Secondary | ICD-10-CM | POA: Diagnosis not present

## 2023-10-28 ENCOUNTER — Ambulatory Visit: Payer: PPO | Attending: Internal Medicine | Admitting: Internal Medicine

## 2023-10-28 ENCOUNTER — Encounter: Payer: Self-pay | Admitting: Internal Medicine

## 2023-10-28 VITALS — BP 136/88 | HR 121 | Ht 72.0 in | Wt 259.4 lb

## 2023-10-28 DIAGNOSIS — I4891 Unspecified atrial fibrillation: Secondary | ICD-10-CM | POA: Diagnosis not present

## 2023-10-28 DIAGNOSIS — I10 Essential (primary) hypertension: Secondary | ICD-10-CM

## 2023-10-28 DIAGNOSIS — I480 Paroxysmal atrial fibrillation: Secondary | ICD-10-CM | POA: Diagnosis not present

## 2023-10-28 MED ORDER — AMIODARONE HCL 200 MG PO TABS: ORAL_TABLET | ORAL | 3 refills | Status: DC

## 2023-10-28 MED ORDER — METOPROLOL TARTRATE 50 MG PO TABS: 50.0000 mg | ORAL_TABLET | Freq: Two times a day (BID) | ORAL | 3 refills | Status: DC

## 2023-10-28 NOTE — Patient Instructions (Signed)
 Medication Instructions:  Your physician has recommended you make the following change in your medication:  Start taking Amiodarone  200 mg twice daily for three weeks and then 200 mg once daily Start taking Metoprolol  Tartrate 50 mg twice daily  Stop taking Coreg   Continue taking all other medications as prescribed  Labwork: None  Testing/Procedures: None  Follow-Up: Your physician recommends that you schedule a follow-up appointment in: Nurse visit in 2 weeks and follow up in 2 months  Any Other Special Instructions Will Be Listed Below (If Applicable). Thank you for choosing [facility]!     If you need a refill on your cardiac medications before your next appointment, please call your pharmacy.

## 2023-10-28 NOTE — Progress Notes (Signed)
 Cardiology Office Note  Date: 10/28/2023   ID: Jeffery QUEZADA Sr., DOB 1956/06/05, MRN [id number]  PCP:  Levada Raymond, PA  Cardiologist:  Lasalle Pointer, MD Electrophysiologist:  None   Reason for Office Visit: Follow-up of paroxysmal A-fib   History of Present Illness: Jeffery Nyce. is a 68 y.o. male known to have paroxysmal A-fib, HTN, recurrent DVT/PE on Coumadin (refused DOAC) is here for follow-up visit.  Patient was initially referred to cardiology clinic for palpitations. Cardiac event monitor in 2018 showed multiple runs of nonsustained V. tach along with multiple runs of rapid atrial fibrillation. He was placed on carvedilol  with significant improvement in his symptoms.  He is here today for follow-up visit.  EKG today showed A-fib with RVR, HR 121 bpm.  He has been drinking alcohol for the last 2 decades and recently stopped/quit alcohol 1 to 2 days ago.  Does have palpitations.  No SOB, fatigue.  Reported having palpitations when A-fib acts up.  Did not undergo any DCCV previously.  No angina, dizziness, syncope and leg swelling.  Past Medical History:  Diagnosis Date   Hypertension    Palpitations     Past Surgical History:  Procedure Laterality Date   NO PAST SURGERIES      Current Outpatient Medications  Medication Sig Dispense Refill   amLODipine (NORVASC) 5 MG tablet Take 5 mg by mouth daily.     Ascorbic Acid (VITAMIN C) 1000 MG tablet Take 1,000 mg by mouth daily.     carvedilol  (COREG ) 12.5 MG tablet Take 1 tablet (12.5 mg total) by mouth 2 (two) times daily. 180 tablet 3   hydrochlorothiazide  (HYDRODIURIL ) 25 MG tablet TAKE 1/2 TABLET BY MOUTH THREE TIMES A WEEK ON MONDAY, WEDNESDAY, AND FRIDAY - needs APPOINTMENT FOR MORE REFILLS 15 tablet 3   lisinopril  (ZESTRIL ) 40 MG tablet Take 1 tablet (40 mg total) by mouth daily. 30 tablet 0   Multiple Vitamin (MULTIVITAMIN ADULT PO) Take 1 tablet by mouth daily.     Multiple Vitamins-Minerals  (PRESERVISION AREDS 2 PO) Take 1 tablet by mouth in the morning and at bedtime.      Omega-3 Fatty Acids (FISH OIL) 1200 MG CAPS Take 2 capsules by mouth every morning.      omeprazole (PRILOSEC OTC) 20 MG tablet Take 20 mg by mouth daily.     warfarin (COUMADIN) 10 MG tablet Take 10 mg by mouth daily. PCP DOSING     No current facility-administered medications for this visit.   Allergies:  Codeine   Social History: The patient  reports that he quit smoking about 15 years ago. His smoking use included e-cigarettes. He has never used smokeless tobacco.   Family History: The patient's family history includes Arrhythmia in his brother; CVA in his paternal grandfather; Heart disease in his father.   ROS:  Please see the history of present illness. Otherwise, complete review of systems is positive for none  All other systems are reviewed and negative.   Physical Exam: VS:  BP 136/88   Pulse (!) 121   Ht 6' (1.829 m)   Wt 259 lb 6.4 oz (117.7 kg)   SpO2 98%   BMI 35.18 kg/m , BMI Body mass index is 35.18 kg/m.  Wt Readings from Last 3 Encounters:  10/28/23 259 lb 6.4 oz (117.7 kg)  10/25/22 252 lb 3.2 oz (114.4 kg)  06/19/20 239 lb (108.4 kg)    General: Patient appears comfortable at rest. HEENT:  Conjunctiva and lids normal, oropharynx clear with moist mucosa. Neck: Supple, no elevated JVP or carotid bruits, no thyromegaly. Lungs: Clear to auscultation, nonlabored breathing at rest. Cardiac: Regular rate and rhythm, no S3 or significant systolic murmur, no pericardial rub. Abdomen: Soft, nontender, no hepatomegaly, bowel sounds present, no guarding or rebound. Extremities: No pitting edema, distal pulses 2+. Skin: Warm and dry. Musculoskeletal: No kyphosis. Neuropsychiatric: Alert and oriented x3, affect grossly appropriate.  Recent Labwork: No results found for requested labs within last 365 days.  No results found for: "CHOL", "TRIG", "HDL", "CHOLHDL", "VLDL", "LDLCALC",  "LDLDIRECT"  Other Studies Reviewed Today:   Assessment and Plan:   Atrial fibrillation with RVR: EKG today showed atrial fibrillation with RVR and previous EKG with NSR was from April 2024.  Drinks alcohol for the last 2 decades, quit 1 to 2 days ago.  Symptomatic with palpitations.  DC Coreg , start metoprolol  tartrate 50 mg twice daily, start amiodarone  200 mg twice daily for 3 weeks followed by 200 mg once daily.  Discussed the side effects of amiodarone .  Only short-term, for 3 months.  Continue Coumadin.  Goal INR between 2 and 3.  He will return back for nurse visit for EKG.  Hopefully converts to NSR by that time.  If not, we will need to schedule telephone visit to schedule for DCCV.  Multiple runs of NSVT on cardiac monitor in 2018: No intervention.  Monitor.  Recurrent DVT on Coumadin: Continue Coumadin, goal INR between 2 and 3.  Previously refused DOAC.  HTN, controlled: Continue current antihypertensives.  HCTZ 25 mg, lisinopril  40 mg once daily, amlodipine 5 mg once daily and other medication changes as stated above.  Alcohol abuse: Drinks 4-5 bottles of alcohol on the weekends for the last 2 decades.  Quit 1 to 2 days ago.    Medication Adjustments/Labs and Tests Ordered: Current medicines are reviewed at length with the patient today.  Concerns regarding medicines are outlined above.   Tests Ordered: Orders Placed This Encounter  Procedures   EKG 12-Lead    Medication Changes: No orders of the defined types were placed in this encounter.   Disposition:  Follow up 3 months with me and 2 weeks for nurse visit for EKG.  Signed Tareq Dwan Beauford Bounds, MD, 10/28/2023 9:41 AM    [facility] at [facility] [address] [place], [place], [place] [address]

## 2023-11-06 DIAGNOSIS — Z7901 Long term (current) use of anticoagulants: Secondary | ICD-10-CM | POA: Diagnosis not present

## 2023-11-11 ENCOUNTER — Ambulatory Visit: Attending: Family Medicine | Admitting: *Deleted

## 2023-11-11 DIAGNOSIS — I4891 Unspecified atrial fibrillation: Secondary | ICD-10-CM

## 2023-11-11 NOTE — Progress Notes (Signed)
 Patient in office for EKG - states he took his morning medications about 30 minutes prior to visit.  No complaints at this time.

## 2023-11-19 ENCOUNTER — Encounter: Payer: Self-pay | Admitting: Internal Medicine

## 2023-11-19 DIAGNOSIS — L568 Other specified acute skin changes due to ultraviolet radiation: Secondary | ICD-10-CM | POA: Diagnosis not present

## 2023-11-19 DIAGNOSIS — B078 Other viral warts: Secondary | ICD-10-CM | POA: Diagnosis not present

## 2023-11-19 DIAGNOSIS — L570 Actinic keratosis: Secondary | ICD-10-CM | POA: Diagnosis not present

## 2023-11-19 DIAGNOSIS — R208 Other disturbances of skin sensation: Secondary | ICD-10-CM | POA: Diagnosis not present

## 2023-11-19 DIAGNOSIS — L538 Other specified erythematous conditions: Secondary | ICD-10-CM | POA: Diagnosis not present

## 2023-11-21 ENCOUNTER — Ambulatory Visit: Attending: Internal Medicine | Admitting: Internal Medicine

## 2023-11-21 ENCOUNTER — Telehealth: Payer: Self-pay | Admitting: Internal Medicine

## 2023-11-21 ENCOUNTER — Encounter: Payer: Self-pay | Admitting: Internal Medicine

## 2023-11-21 ENCOUNTER — Encounter: Payer: Self-pay | Admitting: *Deleted

## 2023-11-21 VITALS — BP 118/80 | HR 98 | Ht 72.0 in | Wt 254.0 lb

## 2023-11-21 DIAGNOSIS — I4891 Unspecified atrial fibrillation: Secondary | ICD-10-CM

## 2023-11-21 DIAGNOSIS — Z01812 Encounter for preprocedural laboratory examination: Secondary | ICD-10-CM

## 2023-11-21 DIAGNOSIS — I10 Essential (primary) hypertension: Secondary | ICD-10-CM

## 2023-11-21 NOTE — Patient Instructions (Signed)
 Medication Instructions:   Continue all current medications.   Labwork:  BMET, CBC - orders given  Testing/Procedures:  Your physician has recommended that you have a Cardioversion (DCCV). Electrical Cardioversion uses a jolt of electricity to your heart either through paddles or wired patches attached to your chest. This is a controlled, usually prescheduled, procedure. Defibrillation is done under light anesthesia in the hospital, and you usually go home the day of the procedure. This is done to get your heart back into a normal rhythm. You are not awake for the procedure. Please see the instruction sheet given to you today.  Your physician has requested that you have an echocardiogram. Echocardiography is a painless test that uses sound waves to create images of your heart. It provides your doctor with information about the size and shape of your heart and how well your heart's chambers and valves are working. This procedure takes approximately one hour. There are no restrictions for this procedure. Please do NOT wear cologne, perfume, aftershave, or lotions (deodorant is allowed). Please arrive 15 minutes prior to your appointment time.  Please note: We ask at that you not bring children with you during ultrasound (echo/ vascular) testing. Due to room size and safety concerns, children are not allowed in the ultrasound rooms during exams. Our front office staff cannot provide observation of children in our lobby area while testing is being conducted. An adult accompanying a patient to their appointment will only be allowed in the ultrasound room at the discretion of the ultrasound technician under special circumstances. We apologize for any inconvenience - TO BE DONE AFTER CARDIOVERSION.  Follow-Up:  1 month post cardioversion   Any Other Special Instructions Will Be Listed Below (If Applicable).   If you need a refill on your cardiac medications before your next appointment, please call  your pharmacy.

## 2023-11-21 NOTE — Telephone Encounter (Signed)
 Checking percert on the following patient for testing scheduled at [facility].   DCCV  - afib - Wednesday, 5/21 - branch

## 2023-11-21 NOTE — Progress Notes (Signed)
 Cardiology Office Note  Date: 11/21/2023   ID: Jeffery Meyer Sr., DOB [birth year], MRN [id number]  PCP:  Levada Raymond, PA  Cardiologist:  Lasalle Pointer, MD Electrophysiologist:  None   Reason for Office Visit: Follow-up of persistent A-fib   History of Present Illness: Jeffery Geis. is a 68 y.o. 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  Patient was initially referred to cardiology clinic for palpitations. Cardiac event monitor in 2018 showed multiple runs of nonsustained V. tach along with multiple runs of rapid atrial fibrillation. He was placed on carvedilol  with significant improvement in his symptoms. He had new onset A-fib with RVR in April 2025 (last known EKG with normal sinus rhythm was from April 2024).  Symptomatic with palpitations, started him on metoprolol  and amiodarone  in April 2025 with repeat EKG in May 2025 showing the same A-fib with RVR.  Quit alcohol around 10 days ago.  He also has symptoms of SOB and fatigue for the last 1-1 and half months according to the wife.  No other symptoms of dizziness, syncope or leg swelling.   Past Medical History:  Diagnosis Date   Hypertension    Palpitations     Past Surgical History:  Procedure Laterality Date   NO PAST SURGERIES      Current Outpatient Medications  Medication Sig Dispense Refill   amiodarone  (PACERONE ) 200 MG tablet Take 1 tablet (200 mg total) by mouth 2 (two) times daily for 21 days, THEN 1 tablet (200 mg total) daily. 90 tablet 3   amLODipine (NORVASC) 5 MG tablet Take 5 mg by mouth daily.     Ascorbic Acid (VITAMIN C) 1000 MG tablet Take 1,000 mg by mouth daily.     hydrochlorothiazide  (HYDRODIURIL ) 25 MG tablet TAKE 1/2 TABLET BY MOUTH THREE TIMES A WEEK ON MONDAY, WEDNESDAY, AND FRIDAY - needs APPOINTMENT FOR MORE REFILLS 15 tablet 3   lisinopril  (ZESTRIL ) 40 MG tablet Take 1 tablet (40 mg total) by mouth  daily. 30 tablet 0   metoprolol  tartrate (LOPRESSOR ) 50 MG tablet Take 1 tablet (50 mg total) by mouth 2 (two) times daily. 60 tablet 3   Multiple Vitamin (MULTIVITAMIN ADULT PO) Take 1 tablet by mouth daily.     Multiple Vitamins-Minerals (PRESERVISION AREDS 2 PO) Take 1 tablet by mouth in the morning and at bedtime.      Omega-3 Fatty Acids (FISH OIL) 1200 MG CAPS Take 2 capsules by mouth every morning.      omeprazole (PRILOSEC OTC) 20 MG tablet Take 20 mg by mouth daily.     warfarin (COUMADIN) 10 MG tablet Take 10 mg by mouth daily. PCP DOSING     No current facility-administered medications for this visit.   Allergies:  Codeine   Social History: The patient  reports that he quit smoking about 15 years ago. His smoking use included e-cigarettes. He has never used smokeless tobacco.   Family History: The patient's family history includes Arrhythmia in his brother; CVA in his paternal grandfather; Heart disease in his father.   ROS:  Please see the history of present illness. Otherwise, complete review of systems is positive for none  All other systems are reviewed and negative.   Physical Exam: VS:  There were no vitals taken for this visit., BMI There is no height or weight on file to calculate BMI.  Wt Readings from Last 3 Encounters:  10/28/23 259  lb 6.4 oz (117.7 kg)  10/25/22 252 lb 3.2 oz (114.4 kg)  06/19/20 239 lb (108.4 kg)    General: Patient appears comfortable at rest. HEENT: Conjunctiva and lids normal, oropharynx clear with moist mucosa. Neck: Supple, no elevated JVP or carotid bruits, no thyromegaly. Lungs: Clear to auscultation, nonlabored breathing at rest. Cardiac: Regular rate and rhythm, no S3 or significant systolic murmur, no pericardial rub. Abdomen: Soft, nontender, no hepatomegaly, bowel sounds present, no guarding or rebound. Extremities: No pitting edema, distal pulses 2+. Skin: Warm and dry. Musculoskeletal: No kyphosis. Neuropsychiatric: Alert and  oriented x3, affect grossly appropriate.  Recent Labwork: No results found for requested labs within last 365 days.  No results found for: "CHOL", "TRIG", "HDL", "CHOLHDL", "VLDL", "LDLCALC", "LDLDIRECT"   Assessment and Plan:   Atrial fibrillation with RVR: Last known EKG with NSR was April 2024.  New onset A-fib with RVR in April 2025, metoprolol  and amiodarone  started with repeat EKG showing the same, A-fib with RVR around 10 days ago. Symptomatic with palpitations, SOB and fatigue for the last 1 to 1/66-month. Continue Coumadin. Goal INR between 2 and 3. Will need rhythm control to control HR. Agreeable for DCCV.  Continue amiodarone  post DCCV, tolerated well without any side effects.  Obtain echocardiogram after DCCV.  Informed consent for DCCV Risks and benefits of the procedure DCCV are explained to the patient.  Risks include, but not limited to, chest pain, infection, pneumonia, cardiac arrest, possibility of PPM implantation.  Patient comprehended these risks and agreed to proceed with the procedure.  Multiple runs of NSVT on cardiac monitor in 2018: No interval, monitor.  Currently on beta-blocker.  Recurrent DVT on Coumadin: Continue Coumadin, goal INR between 2 and 3.  Previously refused DOAC.  HTN, controlled: Continue current antihypertensives.  HCTZ 25 mg once daily, lisinopril  40 mg once daily, amlodipine 5 mg once daily, metoprolol  tartrate 50 mg twice daily.  Alcohol abuse: Drinks 4-5 bottles of alcohol on the weekends for the last 2 decades.  Quit around 10 days ago.    Medication Adjustments/Labs and Tests Ordered: Current medicines are reviewed at length with the patient today.  Concerns regarding medicines are outlined above.   Tests Ordered: No orders of the defined types were placed in this encounter.   Medication Changes: No orders of the defined types were placed in this encounter.   Disposition:  Follow up 1 month after DCCV  Signed Logyn Kendrick Beauford Bounds, MD, 11/21/2023 12:57 PM    [facility] at [facility] [address] [place], [place], [place] [address]

## 2023-11-24 ENCOUNTER — Telehealth: Payer: Self-pay | Admitting: *Deleted

## 2023-11-24 NOTE — Telephone Encounter (Signed)
 Fax received from pcp -   Last 3 INR's listed below :  09/02/2023 - INR 2.6 10/07/2023 - INR 2.7 11/06/2023 - INR 2.5

## 2023-11-24 NOTE — Patient Instructions (Signed)
 Jeffery Meyer Sr.  11/24/2023     @PREFPERIOPPHARMACY @   Your procedure is scheduled on  11/26/2023.   Report to [facility] at  0700  A.M.   Call this number if you have problems the morning of surgery:  [phone]  If you experience any cold or flu symptoms such as cough, fever, chills, shortness of breath, etc. between now and your scheduled surgery, please notify us  at the above number.   Remember:  Do not eat after midnight.    You may drink clear liquids until 0500 am on 11/26/2023.   Clear liquids allowed are:                    Water, Juice (No red color; non-citric and without pulp; diabetics please choose diet or no sugar options), Carbonated beverages (diabetics please choose diet or no sugar options), Clear Tea (No creamer, milk, or cream, including half & half and powdered creamer), Black Coffee Only (No creamer, milk or cream, including half & half and powdered creamer), and Clear Sports drink (No red color; diabetics please choose diet or no sugar options)    Take these medicines the morning of surgery with A SIP OF WATER                                   Amlodipine, omeprazole.    Do not wear jewelry, make-up or nail polish, including gel polish,  artificial nails, or any other type of covering on natural nails (fingers and  toes).  Do not wear lotions, powders, or perfumes, or deodorant.  Do not shave 48 hours prior to surgery.  Men may shave face and neck.  Do not bring valuables to the hospital.  [facility] is not responsible for any belongings or valuables.  Contacts, dentures or bridgework may not be worn into surgery.  Leave your suitcase in the car.  After surgery it may be brought to your room.  For patients admitted to the hospital, discharge time will be determined by your treatment team.  Patients discharged the day of surgery will not be allowed to drive home and must have someone with them for 24 hours.    Special instructions:   DO  NOT smoke tobacco or vape for 24 hours before your procedure.  Please read over the following fact sheets that you were given. Anesthesia Post-op Instructions and Care and Recovery After Surgery      Electrical Cardioversion Electrical cardioversion is the delivery of a jolt of electricity to restore a normal rhythm to the heart. A rhythm that is too fast or is not regular (arrhythmia) keeps the heart from pumping blood well. There is also another type of cardioversion called a chemical (pharmacologic) cardioversion. This is when your health care provider gives you one or more medicines to bring back your regular heart rhythm. Electrical cardioversion is done as a scheduled procedure for arrhythmiasthat are not life-threatening. Electrical cardioversion may also be done in an emergency for sudden life-threatening arrhythmias. Tell a health care provider about: Any allergies you have. All medicines you are taking, including vitamins, herbs, eye drops, creams, and over-the-counter medicines. Any problems you or family members have had with sedatives or anesthesia. Any bleeding problems you have. Any surgeries you have had, including a pacemaker, defibrillator, or other implanted device. Any medical conditions you have. Whether you are pregnant or may  be pregnant. What are the risks? Your provider will talk with you about risks. These include: Allergic reactions to medicines. Irritation to the skin on your chest or back where the sticky pads (electrodes) or paddles were put during electrical cardioversion. A blood clot that breaks free and travels to other parts of your body, such as your brain. Return of a worse abnormal heart rhythm that will need to be treated with medicines, a pacemaker, or an implantable cardioverter defibrillator (ICD). What happens before the procedure? Medicines Your provider may give you: Blood-thinning medicines (anticoagulants) so your blood does not clot as  easily. If your provider gives you this medicine, you may need to take it for 4 weeks before the procedure. Medicines to help stabilize your heart rate and rhythm. Ask your provider about: Changing or stopping your regular medicines. These include any diabetes medicines or blood thinners you take. Taking medicines such as aspirin and ibuprofen. These medicines can thin your blood. Do not take them unless your provider tells you to. Taking over-the-counter medicines, vitamins, herbs, and supplements. General instructions Follow instructions from your provider about what you may eat and drink. Do not put any lotions, powders, or ointments on your chest and back for 24 hours before the procedure. They can cause problems with the electrodes or paddles used to deliver electricity to your heart. Do not wear jewelry as this can interfere with delivering electricity to your heart. If you will be going home right after the procedure, plan to have a responsible adult: Take you home from the hospital or clinic. You will not be allowed to drive. Care for you for the time you are told. Tests You may have an exam or testing. This may include: Blood labs. A transesophageal echocardiogram (TEE). What happens during the procedure?     An IV will be inserted into one of your veins. You will be given a sedative. This helps you relax. Electrodes or metal paddles will be placed on your chest. They may be placed in one of these ways: One placed on your right chest, the other on the left ribs. One placed on your chest and the other on your back. An electrical shock will be delivered. The shock briefly stops (resets) your heart rhythm. Your provider will check to see if your heart rhythm is now normal. Some people need only one shock. Some need more to restore a normal heart rhythm. The procedure may vary among providers and hospitals. What happens after the procedure? Your blood pressure, heart rate,  breathing rate, and blood oxygen level will be monitored until you leave the hospital or clinic. Your heart rhythm will be watched to make sure it does not change. This information is not intended to replace advice given to you by your health care provider. Make sure you discuss any questions you have with your health care provider. Document Revised: 02/14/2022 Document Reviewed: 02/14/2022 Elsevier Patient Education  2024 [employer].General Anesthesia, Adult, Care After The following information offers guidance on how to care for yourself after your procedure. Your health care provider may also give you more specific instructions. If you have problems or questions, contact your health care provider. What can I expect after the procedure? After the procedure, it is common for people to: Have pain or discomfort at the IV site. Have nausea or vomiting. Have a sore throat or hoarseness. Have trouble concentrating. Feel cold or chills. Feel weak, sleepy, or tired (fatigue). Have soreness and body aches. These can  affect parts of the body that were not involved in surgery. Follow these instructions at home: For the time period you were told by your health care provider:  Rest. Do not participate in activities where you could fall or become injured. Do not drive or use machinery. Do not drink alcohol. Do not take sleeping pills or medicines that cause drowsiness. Do not make important decisions or sign legal documents. Do not take care of children on your own. General instructions Drink enough fluid to keep your urine pale yellow. If you have sleep apnea, surgery and certain medicines can increase your risk for breathing problems. Follow instructions from your health care provider about wearing your sleep device: Anytime you are sleeping, including during daytime naps. While taking prescription pain medicines, sleeping medicines, or medicines that make you drowsy. Return to your normal  activities as told by your health care provider. Ask your health care provider what activities are safe for you. Take over-the-counter and prescription medicines only as told by your health care provider. Do not use any products that contain nicotine or tobacco. These products include cigarettes, chewing tobacco, and vaping devices, such as e-cigarettes. These can delay incision healing after surgery. If you need help quitting, ask your health care provider. Contact a health care provider if: You have nausea or vomiting that does not get better with medicine. You vomit every time you eat or drink. You have pain that does not get better with medicine. You cannot urinate or have bloody urine. You develop a skin rash. You have a fever. Get help right away if: You have trouble breathing. You have chest pain. You vomit blood. These symptoms may be an emergency. Get help right away. Call 911. Do not wait to see if the symptoms will go away. Do not drive yourself to the hospital. Summary After the procedure, it is common to have a sore throat, hoarseness, nausea, vomiting, or to feel weak, sleepy, or fatigue. For the time period you were told by your health care provider, do not drive or use machinery. Get help right away if you have difficulty breathing, have chest pain, or vomit blood. These symptoms may be an emergency. This information is not intended to replace advice given to you by your health care provider. Make sure you discuss any questions you have with your health care provider. Document Revised: 09/21/2021 Document Reviewed: 09/21/2021 Elsevier Patient Education  2024 [employer].

## 2023-11-25 ENCOUNTER — Encounter (HOSPITAL_COMMUNITY): Payer: Self-pay

## 2023-11-25 ENCOUNTER — Encounter (HOSPITAL_COMMUNITY)
Admission: RE | Admit: 2023-11-25 | Discharge: 2023-11-25 | Disposition: A | Discharge status: Home / Self Care | Source: Ambulatory Visit | Attending: Cardiology | Admitting: Cardiology

## 2023-11-25 DIAGNOSIS — Z01818 Encounter for other preprocedural examination: Secondary | ICD-10-CM

## 2023-11-25 DIAGNOSIS — Z01812 Encounter for preprocedural laboratory examination: Secondary | ICD-10-CM | POA: Diagnosis not present

## 2023-11-25 DIAGNOSIS — Z79899 Other long term (current) drug therapy: Secondary | ICD-10-CM | POA: Insufficient documentation

## 2023-11-25 HISTORY — DX: Cardiac arrhythmia, unspecified: I49.9

## 2023-11-25 HISTORY — DX: Acute embolism and thrombosis of unspecified deep veins of unspecified lower extremity: I82.409

## 2023-11-25 LAB — CBC WITH DIFFERENTIAL/PLATELET
Abs Immature Granulocytes: 0.02 10*3/uL (ref 0.00–0.07)
Basophils Absolute: 0.1 10*3/uL (ref 0.0–0.1)
Basophils Relative: 1 %
Eosinophils Absolute: 0.3 10*3/uL (ref 0.0–0.5)
Eosinophils Relative: 5 %
HCT: 42.4 % (ref 39.0–52.0)
Hemoglobin: 14.3 g/dL (ref 13.0–17.0)
Immature Granulocytes: 0 %
Lymphocytes Relative: 24 %
Lymphs Abs: 1.5 10*3/uL (ref 0.7–4.0)
MCH: 32.7 pg (ref 26.0–34.0)
MCHC: 33.7 g/dL (ref 30.0–36.0)
MCV: 97 fL (ref 80.0–100.0)
Monocytes Absolute: 0.7 10*3/uL (ref 0.1–1.0)
Monocytes Relative: 12 %
Neutro Abs: 3.7 10*3/uL (ref 1.7–7.7)
Neutrophils Relative %: 58 %
Platelets: 203 10*3/uL (ref 150–400)
RBC: 4.37 MIL/uL (ref 4.22–5.81)
RDW: 12.9 % (ref 11.5–15.5)
WBC: 6.3 10*3/uL (ref 4.0–10.5)
nRBC: 0 % (ref 0.0–0.2)

## 2023-11-25 LAB — BASIC METABOLIC PANEL WITH GFR
Anion gap: 9 (ref 5–15)
BUN: 24 mg/dL — ABNORMAL HIGH (ref 8–23)
CO2: 22 mmol/L (ref 22–32)
Calcium: 9.2 mg/dL (ref 8.9–10.3)
Chloride: 103 mmol/L (ref 98–111)
Creatinine, Ser: 1.13 mg/dL (ref 0.61–1.24)
GFR, Estimated: >60 mL/min (ref >60)
Glucose, Bld: 88 mg/dL (ref 70–99)
Potassium: 3.8 mmol/L (ref 3.5–5.1)
Sodium: 134 mmol/L — ABNORMAL LOW (ref 135–145)

## 2023-11-26 ENCOUNTER — Ambulatory Visit (HOSPITAL_COMMUNITY)
Admission: RE | Admit: 2023-11-26 | Discharge: 2023-11-26 | Disposition: A | Discharge status: Home / Self Care | Attending: Cardiology | Admitting: Cardiology

## 2023-11-26 ENCOUNTER — Encounter (HOSPITAL_COMMUNITY)
Admission: RE | Disposition: A | Discharge status: Home / Self Care | Payer: Self-pay | Source: Home / Self Care | Attending: Cardiology

## 2023-11-26 ENCOUNTER — Encounter (HOSPITAL_COMMUNITY): Payer: Self-pay | Admitting: Cardiology

## 2023-11-26 ENCOUNTER — Ambulatory Visit (HOSPITAL_BASED_OUTPATIENT_CLINIC_OR_DEPARTMENT_OTHER)

## 2023-11-26 ENCOUNTER — Other Ambulatory Visit: Payer: Self-pay

## 2023-11-26 ENCOUNTER — Ambulatory Visit (HOSPITAL_COMMUNITY)

## 2023-11-26 DIAGNOSIS — Z7901 Long term (current) use of anticoagulants: Secondary | ICD-10-CM | POA: Insufficient documentation

## 2023-11-26 DIAGNOSIS — Z01818 Encounter for other preprocedural examination: Secondary | ICD-10-CM

## 2023-11-26 DIAGNOSIS — I10 Essential (primary) hypertension: Secondary | ICD-10-CM | POA: Diagnosis not present

## 2023-11-26 DIAGNOSIS — Z87891 Personal history of nicotine dependence: Secondary | ICD-10-CM | POA: Insufficient documentation

## 2023-11-26 DIAGNOSIS — Z79899 Other long term (current) drug therapy: Secondary | ICD-10-CM | POA: Insufficient documentation

## 2023-11-26 DIAGNOSIS — I4819 Other persistent atrial fibrillation: Secondary | ICD-10-CM

## 2023-11-26 DIAGNOSIS — I472 Ventricular tachycardia, unspecified: Secondary | ICD-10-CM | POA: Diagnosis not present

## 2023-11-26 DIAGNOSIS — I4891 Unspecified atrial fibrillation: Secondary | ICD-10-CM | POA: Diagnosis not present

## 2023-11-26 HISTORY — PX: CARDIOVERSION: SHX1299

## 2023-11-26 LAB — PROTIME-INR
INR: 4.5 (ref 0.8–1.2)
Prothrombin Time: 42.6 s — ABNORMAL HIGH (ref 11.4–15.2)

## 2023-11-26 SURGERY — CARDIOVERSION
Anesthesia: General

## 2023-11-26 MED ORDER — CHLORHEXIDINE GLUCONATE 0.12 % MT SOLN: 15.0000 mL | Freq: Once | OROMUCOSAL | Status: DC

## 2023-11-26 MED ORDER — LACTATED RINGERS IV SOLN
INTRAVENOUS | Status: DC
Start: 2023-11-26 — End: 2023-11-26

## 2023-11-26 MED ORDER — ORAL CARE MOUTH RINSE: 15.0000 mL | Freq: Once | OROMUCOSAL | Status: DC

## 2023-11-26 MED ORDER — PROPOFOL 10 MG/ML IV BOLUS
INTRAVENOUS | Status: DC | PRN
  Administered 2023-11-26: 80 mg via INTRAVENOUS

## 2023-11-26 NOTE — CV Procedure (Addendum)
 CV Procedure Note  Procedure: Electrical cardioversion Indication: Persistent atrial fibrillation Physician: Dr Armida Lander MD  Patient presented for outpatient electrical cardioversion for persistent atrial fibrillations. INRs have been therapeutic as listed below. 09/02/2023 - INR 2.6 10/07/2023 - INR 2.7 11/06/2023 - INR 2.5 11/26/23: INR 4.5  He was brought to the procedure suite after appropriate consent was obtained. Defib pads placed in the anterior and posterior positions. Sedation achieved with the assistance of anesthesiology, for details please refer to their documentation. Succesfully converted to normal sinus rhythm with a single synchronzied 200j shock. Cardiopulmonary monitoring was performed throughout the procedure, he tolerated well without complications.   INR elevated, we have forwarded results to his pcp who manages his coumadin and made family aware.    Armida Lander MD

## 2023-11-26 NOTE — Anesthesia Preprocedure Evaluation (Addendum)
 Anesthesia Evaluation  Patient identified by MRN, date of birth, ID band Patient awake    Reviewed: Allergy & Precautions, H&P , NPO status , Patient's Chart, lab work & pertinent test results  Airway Mallampati: II  TM Distance: >3 FB Neck ROM: Full    Dental no notable dental hx.    Pulmonary Patient abstained from smoking., former smoker   Pulmonary exam normal breath sounds clear to auscultation       Cardiovascular hypertension, Normal cardiovascular exam+ dysrhythmias Atrial Fibrillation  Rhythm:Irregular Rate:Abnormal     Neuro/Psych negative neurological ROS  negative psych ROS   GI/Hepatic negative GI ROS, Neg liver ROS,,,  Endo/Other  negative endocrine ROS    Renal/GU negative Renal ROS  negative genitourinary   Musculoskeletal negative musculoskeletal ROS (+)    Abdominal   Peds negative pediatric ROS (+)  Hematology negative hematology ROS (+)   Anesthesia Other Findings   Reproductive/Obstetrics negative OB ROS                             Anesthesia Physical Anesthesia Plan  ASA: 3  Anesthesia Plan: General   Post-op Pain Management:    Induction: Intravenous  PONV Risk Score and Plan:   Airway Management Planned:   Additional Equipment:   Intra-op Plan:   Post-operative Plan:   Informed Consent: I have reviewed the patients History and Physical, chart, labs and discussed the procedure including the risks, benefits and alternatives for the proposed anesthesia with the patient or authorized representative who has indicated his/her understanding and acceptance.     Dental advisory given  Plan Discussed with: CRNA  Anesthesia Plan Comments:        Anesthesia Quick Evaluation

## 2023-11-26 NOTE — Anesthesia Postprocedure Evaluation (Signed)
 Anesthesia Post Note  Patient: Jeffery SHALER Sr.  Procedure(s) Performed: CARDIOVERSION  Anesthesia Type: General Anesthetic complications: no   No notable events documented.   Last Vitals:  Vitals:   11/26/23 0754  BP: (!) 126/90  Pulse: 97  Resp: 16  Temp: 36.7 C    Last Pain:  Vitals:   11/26/23 0754  TempSrc: Oral  PainSc: 0-No pain                 Beacher Limerick

## 2023-11-26 NOTE — Progress Notes (Signed)
 CRITICAL RESULT PROVIDER NOTIFICATION  Test performed and critical result:  PT/INR.  INR is 4.5  Date and time result received:  11/26/23 1000  Provider name/title: Dr. Amanda Jungling  Date and time provider notified: 1004 11/26/23  Date and time provider responded: 1004 11/26/23  Provider response: noted.

## 2023-11-26 NOTE — Transfer of Care (Signed)
 Immediate Anesthesia Transfer of Care Note  Patient: Jeffery Meyer Sr.  Procedure(s) Performed: CARDIOVERSION  Patient Location: PACU  Anesthesia Type:General  Level of Consciousness: drowsy  Airway & Oxygen Therapy: Patient Spontanous Breathing and Patient connected to nasal cannula oxygen  Post-op Assessment: Report given to RN and Post -op Vital signs reviewed and stable  Post vital signs: Reviewed and stable  Last Vitals:  Vitals Value Taken Time  BP 118/84   Temp 98.6   Pulse 81   Resp 21   SpO2 97%     Last Pain:  Vitals:   11/26/23 0754  TempSrc: Oral  PainSc: 0-No pain      Patients Stated Pain Goal: 7 (11/26/23 0754)  Complications: No notable events documented.

## 2023-11-26 NOTE — H&P (Signed)
Procedure H&P   Patient presents for outpatient electrical cardioversion in the setting of persistent atrial fibrillation. He is on coumadin for anticoagulation.  Last 3 INR's listed below :  09/02/2023 - INR 2.6 10/07/2023 - INR 2.7 11/06/2023 - INR 2.5   Plan for electrical cardioversion today with the assistance of anesthesiology. For full details of medical history please refer to referenced clinic note below  Armida Lander MD   Cardiology Office Note   Date: 11/21/2023    ID: Jaynee Meyer Sr., DOB 02-23-1956, MRN [id number]   PCP:  Levada Raymond, PA     Cardiologist:  Lasalle Pointer, MD Electrophysiologist:  None    Reason for Office Visit: Follow-up of persistent A-fib     History of Present Illness: Jeffery Chambers. is a 69 y.o. 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   Patient was initially referred to cardiology clinic for palpitations. Cardiac event monitor in 2018 showed multiple runs of nonsustained V. tach along with multiple runs of rapid atrial fibrillation. He was placed on carvedilol  with significant improvement in his symptoms. He had new onset A-fib with RVR in April 2025 (last known EKG with normal sinus rhythm was from April 2024).  Symptomatic with palpitations, started him on metoprolol  and amiodarone  in April 2025 with repeat EKG in May 2025 showing the same A-fib with RVR.  Quit alcohol around 10 days ago.  He also has symptoms of SOB and fatigue for the last 1-1 and half months according to the wife.  No other symptoms of dizziness, syncope or leg swelling.         Past Medical History:  Diagnosis Date   Hypertension     Palpitations                 Past Surgical History:  Procedure Laterality Date   NO PAST SURGERIES                    Current Outpatient Medications  Medication Sig Dispense Refill   amiodarone  (PACERONE ) 200 MG tablet Take 1 tablet  (200 mg total) by mouth 2 (two) times daily for 21 days, THEN 1 tablet (200 mg total) daily. 90 tablet 3   amLODipine (NORVASC) 5 MG tablet Take 5 mg by mouth daily.       Ascorbic Acid (VITAMIN C) 1000 MG tablet Take 1,000 mg by mouth daily.       hydrochlorothiazide  (HYDRODIURIL ) 25 MG tablet TAKE 1/2 TABLET BY MOUTH THREE TIMES A WEEK ON MONDAY, WEDNESDAY, AND FRIDAY - needs APPOINTMENT FOR MORE REFILLS 15 tablet 3   lisinopril  (ZESTRIL ) 40 MG tablet Take 1 tablet (40 mg total) by mouth daily. 30 tablet 0   metoprolol  tartrate (LOPRESSOR ) 50 MG tablet Take 1 tablet (50 mg total) by mouth 2 (two) times daily. 60 tablet 3   Multiple Vitamin (MULTIVITAMIN ADULT PO) Take 1 tablet by mouth daily.       Multiple Vitamins-Minerals (PRESERVISION AREDS 2 PO) Take 1 tablet by mouth in the morning and at bedtime.        Omega-3 Fatty Acids (FISH OIL) 1200 MG CAPS Take 2 capsules by mouth every morning.        omeprazole (PRILOSEC OTC) 20 MG tablet Take 20 mg by mouth daily.       warfarin (COUMADIN) 10 MG tablet Take 10 mg by mouth daily. PCP DOSING  No current facility-administered medications for this visit.      Allergies:  Codeine    Social History: The patient  reports that he quit smoking about 15 years ago. His smoking use included e-cigarettes. He has never used smokeless tobacco.    Family History: The patient's family history includes Arrhythmia in his brother; CVA in his paternal grandfather; Heart disease in his father.    ROS:  Please see the history of present illness. Otherwise, complete review of systems is positive for none  All other systems are reviewed and negative.    Physical Exam: VS:  There were no vitals taken for this visit., BMI There is no height or weight on file to calculate BMI.      Wt Readings from Last 3 Encounters:  10/28/23 259 lb 6.4 oz (117.7 kg)  10/25/22 252 lb 3.2 oz (114.4 kg)  06/19/20 239 lb (108.4 kg)    General: Patient appears  comfortable at rest. HEENT: Conjunctiva and lids normal, oropharynx clear with moist mucosa. Neck: Supple, no elevated JVP or carotid bruits, no thyromegaly. Lungs: Clear to auscultation, nonlabored breathing at rest. Cardiac: Regular rate and rhythm, no S3 or significant systolic murmur, no pericardial rub. Abdomen: Soft, nontender, no hepatomegaly, bowel sounds present, no guarding or rebound. Extremities: No pitting edema, distal pulses 2+. Skin: Warm and dry. Musculoskeletal: No kyphosis. Neuropsychiatric: Alert and oriented x3, affect grossly appropriate.   Recent Labwork: No results found for requested labs within last 365 days.  Labs (Brief)  No results found for: "CHOL", "TRIG", "HDL", "CHOLHDL", "VLDL", "LDLCALC", "LDLDIRECT"       Assessment and Plan:     Atrial fibrillation with RVR: Last known EKG with NSR was April 2024.  New onset A-fib with RVR in April 2025, metoprolol  and amiodarone  started with repeat EKG showing the same, A-fib with RVR around 10 days ago. Symptomatic with palpitations, SOB and fatigue for the last 1 to 1/22-month. Continue Coumadin. Goal INR between 2 and 3. Will need rhythm control to control HR. Agreeable for DCCV.  Continue amiodarone  post DCCV, tolerated well without any side effects.  Obtain echocardiogram after DCCV.  Informed consent for DCCV Risks and benefits of the procedure DCCV are explained to the patient.  Risks include, but not limited to, chest pain, infection, pneumonia, cardiac arrest, possibility of PPM implantation.  Patient comprehended these risks and agreed to proceed with the procedure.   Multiple runs of NSVT on cardiac monitor in 2018: No interval, monitor.  Currently on beta-blocker.   Recurrent DVT on Coumadin: Continue Coumadin, goal INR between 2 and 3.  Previously refused DOAC.   HTN, controlled: Continue current antihypertensives.  HCTZ 25 mg once daily, lisinopril  40 mg once daily, amlodipine 5 mg once daily,  metoprolol  tartrate 50 mg twice daily.   Alcohol abuse: Drinks 4-5 bottles of alcohol on the weekends for the last 2 decades.  Quit around 10 days ago.     Medication Adjustments/Labs and Tests Ordered: Current medicines are reviewed at length with the patient today.  Concerns regarding medicines are outlined above.    Tests Ordered: No orders of the defined types were placed in this encounter.     Medication Changes: No orders of the defined types were placed in this encounter.     Disposition:  Follow up 1 month after DCCV   Signed Vishnu Beauford Bounds, MD, 11/21/2023 12:57 PM    [facility] [address] [place], [place], [place]  [address] 

## 2023-11-27 ENCOUNTER — Encounter (HOSPITAL_COMMUNITY): Payer: Self-pay | Admitting: Cardiology

## 2023-11-28 DIAGNOSIS — Z7901 Long term (current) use of anticoagulants: Secondary | ICD-10-CM | POA: Diagnosis not present

## 2023-12-04 DIAGNOSIS — Z6835 Body mass index (BMI) 35.0-35.9, adult: Secondary | ICD-10-CM | POA: Diagnosis not present

## 2023-12-04 DIAGNOSIS — I4891 Unspecified atrial fibrillation: Secondary | ICD-10-CM | POA: Diagnosis not present

## 2023-12-04 DIAGNOSIS — I10 Essential (primary) hypertension: Secondary | ICD-10-CM | POA: Diagnosis not present

## 2023-12-04 DIAGNOSIS — Z7901 Long term (current) use of anticoagulants: Secondary | ICD-10-CM | POA: Diagnosis not present

## 2023-12-15 ENCOUNTER — Ambulatory Visit: Attending: Internal Medicine

## 2023-12-15 DIAGNOSIS — I4891 Unspecified atrial fibrillation: Secondary | ICD-10-CM | POA: Diagnosis not present

## 2023-12-16 LAB — ECHOCARDIOGRAM COMPLETE
AR max vel: 2.31 cm2
AV Area VTI: 2.33 cm2
AV Area mean vel: 2.34 cm2
AV Mean grad: 8 mmHg
AV Peak grad: 14.1 mmHg
Ao pk vel: 1.88 m/s
Area-P 1/2: 3.28 cm2
Calc EF: 68.6 %
MV VTI: 2.24 cm2
S' Lateral: 3.5 cm
Single Plane A2C EF: 73.5 %
Single Plane A4C EF: 60.9 %

## 2023-12-19 ENCOUNTER — Ambulatory Visit: Payer: Self-pay | Admitting: Internal Medicine

## 2023-12-22 DIAGNOSIS — Z7901 Long term (current) use of anticoagulants: Secondary | ICD-10-CM | POA: Diagnosis not present

## 2023-12-26 ENCOUNTER — Other Ambulatory Visit: Payer: Self-pay | Admitting: Internal Medicine

## 2023-12-29 DIAGNOSIS — Z7901 Long term (current) use of anticoagulants: Secondary | ICD-10-CM | POA: Diagnosis not present

## 2023-12-31 ENCOUNTER — Encounter: Payer: Self-pay | Admitting: Internal Medicine

## 2023-12-31 ENCOUNTER — Ambulatory Visit: Attending: Internal Medicine | Admitting: Internal Medicine

## 2023-12-31 VITALS — BP 128/88 | HR 71 | Ht 72.0 in | Wt 256.4 lb

## 2023-12-31 DIAGNOSIS — I480 Paroxysmal atrial fibrillation: Secondary | ICD-10-CM | POA: Diagnosis not present

## 2023-12-31 DIAGNOSIS — Z7901 Long term (current) use of anticoagulants: Secondary | ICD-10-CM | POA: Insufficient documentation

## 2023-12-31 DIAGNOSIS — I4819 Other persistent atrial fibrillation: Secondary | ICD-10-CM | POA: Insufficient documentation

## 2023-12-31 NOTE — Patient Instructions (Addendum)
 Medication Instructions:  Your physician has recommended you make the following change in your medication:  Stop amiodarone  Continue all other medications as prescribed  Labwork: none  Testing/Procedures: none  Follow-Up: Your physician recommends that you schedule a follow-up appointment in: 1 year. You will receive a reminder call in about 8-10 months reminding you to schedule your appointment. If you don't receive this call, please contact our office.  Any Other Special Instructions Will Be Listed Below (If Applicable). Kardia Mobile   If you need a refill on your cardiac medications before your next appointment, please call your pharmacy.

## 2023-12-31 NOTE — Progress Notes (Signed)
 Cardiology Office Note  Date: 12/31/2023   ID: Jeffery PLATTE Sr., DOB 09/05/1955, MRN [id number]  PCP:  Jeffery Bolt, PA  Cardiologist:  Jeffery SHAUNNA Maywood, MD Electrophysiologist:  None   Reason for Office Visit: Follow-up of persistent A-fib   History of Present Illness: Jeffery Eckrich. is a 68 y.o. 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  Patient was initially referred to cardiology clinic for palpitations. Cardiac event monitor in 2018 showed multiple runs of nonsustained V. tach along with multiple runs of rapid atrial fibrillation. He was placed on carvedilol  with significant improvement in his symptoms. He had new onset A-fib with RVR in April 2025 (last known EKG with normal sinus rhythm was from April 2024).  Symptomatic with palpitations, started him on metoprolol  and amiodarone  in April 2025 with repeat EKG in May 2025 showing the same A-fib with RVR.  He underwent subsequent DCCV in May 2025 with successful conversion to NSR.  Currently on amiodarone  200 mg once daily, metoprolol  tartrate 50 mg twice daily and Coumadin.  He has been on long-term Coumadin therapy due to recurrent DVT/PE.  Does not have any symptoms of angina, DOE.  He feels wonderful after DCCV.  No SOB, palpitations, fatigue.  Patient reported increased duration of blurry vision after starting amiodarone .   Past Medical History:  Diagnosis Date   DVT (deep venous thrombosis) (HCC)    Dysrhythmia    H/O blood clots 1985   Hypertension    Palpitations     Past Surgical History:  Procedure Laterality Date   Ankle Surgery Right    CARDIOVERSION N/A 11/26/2023   Procedure: CARDIOVERSION;  Surgeon: Jeffery Dorn FALCON, MD;  Location: [facility];  Service: Endoscopy;  Laterality: N/A;   INNER EAR SURGERY      Current Outpatient Medications  Medication Sig Dispense Refill   amiodarone  (PACERONE ) 200 MG  tablet Take 200 mg by mouth daily.     amLODipine (NORVASC) 5 MG tablet Take 5 mg by mouth daily.     Ascorbic Acid (VITAMIN C) 1000 MG tablet Take 1,000 mg by mouth daily.     hydrochlorothiazide  (HYDRODIURIL ) 25 MG tablet TAKE 1/2 TABLET BY MOUTH THREE TIMES A WEEK ON MONDAY, WEDNESDAY, AND FRIDAY - needs APPOINTMENT FOR MORE REFILLS 15 tablet 2   lisinopril  (ZESTRIL ) 40 MG tablet Take 1 tablet (40 mg total) by mouth daily. 30 tablet 0   metoprolol  tartrate (LOPRESSOR ) 50 MG tablet Take 1 tablet (50 mg total) by mouth 2 (two) times daily. 60 tablet 3   Multiple Vitamin (MULTIVITAMIN ADULT PO) Take 1 tablet by mouth daily.     Multiple Vitamins-Minerals (PRESERVISION AREDS 2 PO) Take 1 tablet by mouth in the morning and at bedtime.      Omega-3 Fatty Acids (FISH OIL) 1200 MG CAPS Take 2 capsules by mouth every morning.      omeprazole (PRILOSEC OTC) 20 MG tablet Take 20 mg by mouth daily.     warfarin (COUMADIN) 10 MG tablet Take 10 mg by mouth daily. PCP DOSING     No current facility-administered medications for this visit.   Allergies:  Codeine   Social History: The patient  reports that he quit smoking about 15 years ago. His smoking use included e-cigarettes. He has never used smokeless tobacco. He reports current alcohol use. He reports that he does not currently use drugs.  Family History: The patient's family history includes Arrhythmia in his brother; CVA in his paternal grandfather; Heart disease in his father.   ROS:  Please see the history of present illness. Otherwise, complete review of systems is positive for none  All other systems are reviewed and negative.   Physical Exam: VS:  There were no vitals taken for this visit., BMI There is no height or weight on file to calculate BMI.  Wt Readings from Last 3 Encounters:  11/26/23 254 lb (115.2 kg)  11/25/23 254 lb (115.2 kg)  11/21/23 254 lb (115.2 kg)    General: Patient appears comfortable at rest. HEENT: Conjunctiva  and lids normal, oropharynx clear with moist mucosa. Neck: Supple, no elevated JVP or carotid bruits, no thyromegaly. Lungs: Clear to auscultation, nonlabored breathing at rest. Cardiac: Regular rate and rhythm, no S3 or significant systolic murmur, no pericardial rub. Abdomen: Soft, nontender, no hepatomegaly, bowel sounds present, no guarding or rebound. Extremities: No pitting edema, distal pulses 2+. Skin: Warm and dry. Musculoskeletal: No kyphosis. Neuropsychiatric: Alert and oriented x3, affect grossly appropriate.  Recent Labwork: 11/25/2023: BUN 24; Creatinine, Ser 1.13; Hemoglobin 14.3; Platelets 203; Potassium 3.8; Sodium 134  No results found for: CHOL, TRIG, HDL, CHOLHDL, VLDL, LDLCALC, LDLDIRECT   Assessment and Plan:   Persistent A-fib s/p DCCV in EKG today showed NSR.  Asymptomatic.May 2025: Feeling wonderful after DCCV.  Reports increased duration of blurry vision after starting amiodarone , will discontinue amiodarone  today.  Continue metoprolol  tartrate 50 mg twice daily and Coumadin.  Goal INR between 2 and 3.  Multiple runs of NSVT on cardiac monitor in 2018: Currently on beta-blocker.  Recurrent DVT/PE on Coumadin: Continue Coumadin, goal INR between 2 and 3.  Previously refused DOAC.  HTN, controlled: Continue current antihypertensives, amlodipine 5 mg once daily, HCTZ 20 mg once daily, lisinopril  40 mg once daily.  Follows with PCP.  Alcohol abuse: Stopped drinking alcohol 10 days prior to DCCV.  Congratulated and encouraged not to pick up alcohol.    Medication Adjustments/Labs and Tests Ordered: Current medicines are reviewed at length with the patient today.  Concerns regarding medicines are outlined above.   Tests Ordered: No orders of the defined types were placed in this encounter.   Medication Changes: No orders of the defined types were placed in this encounter.   Disposition:  Follow up 1 year  Signed Ardith Lewman Priya Gibson Telleria,  MD, 12/31/2023 9:57 AM    [facility] at [facility] [address] [place], [place], [place] [address]

## 2024-01-05 DIAGNOSIS — Z7901 Long term (current) use of anticoagulants: Secondary | ICD-10-CM | POA: Diagnosis not present

## 2024-01-05 DIAGNOSIS — R208 Other disturbances of skin sensation: Secondary | ICD-10-CM | POA: Diagnosis not present

## 2024-01-05 DIAGNOSIS — L568 Other specified acute skin changes due to ultraviolet radiation: Secondary | ICD-10-CM | POA: Diagnosis not present

## 2024-01-05 DIAGNOSIS — L570 Actinic keratosis: Secondary | ICD-10-CM | POA: Diagnosis not present

## 2024-01-05 DIAGNOSIS — L538 Other specified erythematous conditions: Secondary | ICD-10-CM | POA: Diagnosis not present

## 2024-01-05 DIAGNOSIS — B078 Other viral warts: Secondary | ICD-10-CM | POA: Diagnosis not present

## 2024-01-20 ENCOUNTER — Ambulatory Visit: Admitting: Internal Medicine

## 2024-01-26 ENCOUNTER — Ambulatory Visit: Payer: Self-pay | Admitting: Cardiology

## 2024-02-02 DIAGNOSIS — Z7901 Long term (current) use of anticoagulants: Secondary | ICD-10-CM | POA: Diagnosis not present

## 2024-02-02 DIAGNOSIS — I749 Embolism and thrombosis of unspecified artery: Secondary | ICD-10-CM | POA: Diagnosis not present

## 2024-02-05 DIAGNOSIS — R208 Other disturbances of skin sensation: Secondary | ICD-10-CM | POA: Diagnosis not present

## 2024-02-05 DIAGNOSIS — L570 Actinic keratosis: Secondary | ICD-10-CM | POA: Diagnosis not present

## 2024-02-05 DIAGNOSIS — L568 Other specified acute skin changes due to ultraviolet radiation: Secondary | ICD-10-CM | POA: Diagnosis not present

## 2024-02-05 DIAGNOSIS — B078 Other viral warts: Secondary | ICD-10-CM | POA: Diagnosis not present

## 2024-02-05 DIAGNOSIS — L538 Other specified erythematous conditions: Secondary | ICD-10-CM | POA: Diagnosis not present

## 2024-02-15 ENCOUNTER — Other Ambulatory Visit: Payer: Self-pay | Admitting: Internal Medicine

## 2024-03-01 DIAGNOSIS — Z7901 Long term (current) use of anticoagulants: Secondary | ICD-10-CM | POA: Diagnosis not present

## 2024-03-11 DIAGNOSIS — H2513 Age-related nuclear cataract, bilateral: Secondary | ICD-10-CM | POA: Diagnosis not present

## 2024-03-11 DIAGNOSIS — H04123 Dry eye syndrome of bilateral lacrimal glands: Secondary | ICD-10-CM | POA: Diagnosis not present

## 2024-03-11 DIAGNOSIS — H5203 Hypermetropia, bilateral: Secondary | ICD-10-CM | POA: Diagnosis not present

## 2024-03-11 DIAGNOSIS — H35033 Hypertensive retinopathy, bilateral: Secondary | ICD-10-CM | POA: Diagnosis not present

## 2024-03-24 DIAGNOSIS — R208 Other disturbances of skin sensation: Secondary | ICD-10-CM | POA: Diagnosis not present

## 2024-03-24 DIAGNOSIS — L570 Actinic keratosis: Secondary | ICD-10-CM | POA: Diagnosis not present

## 2024-03-24 DIAGNOSIS — B078 Other viral warts: Secondary | ICD-10-CM | POA: Diagnosis not present

## 2024-03-24 DIAGNOSIS — L538 Other specified erythematous conditions: Secondary | ICD-10-CM | POA: Diagnosis not present

## 2024-03-24 DIAGNOSIS — L568 Other specified acute skin changes due to ultraviolet radiation: Secondary | ICD-10-CM | POA: Diagnosis not present

## 2024-03-30 DIAGNOSIS — I749 Embolism and thrombosis of unspecified artery: Secondary | ICD-10-CM | POA: Diagnosis not present

## 2024-03-30 DIAGNOSIS — I82409 Acute embolism and thrombosis of unspecified deep veins of unspecified lower extremity: Secondary | ICD-10-CM | POA: Diagnosis not present

## 2024-03-30 DIAGNOSIS — Z7901 Long term (current) use of anticoagulants: Secondary | ICD-10-CM | POA: Diagnosis not present

## 2024-04-27 DIAGNOSIS — L538 Other specified erythematous conditions: Secondary | ICD-10-CM | POA: Diagnosis not present

## 2024-04-27 DIAGNOSIS — L568 Other specified acute skin changes due to ultraviolet radiation: Secondary | ICD-10-CM | POA: Diagnosis not present

## 2024-04-27 DIAGNOSIS — R208 Other disturbances of skin sensation: Secondary | ICD-10-CM | POA: Diagnosis not present

## 2024-04-27 DIAGNOSIS — B078 Other viral warts: Secondary | ICD-10-CM | POA: Diagnosis not present

## 2024-04-29 DIAGNOSIS — Z7901 Long term (current) use of anticoagulants: Secondary | ICD-10-CM | POA: Diagnosis not present

## 2024-06-02 DIAGNOSIS — Z125 Encounter for screening for malignant neoplasm of prostate: Secondary | ICD-10-CM | POA: Diagnosis not present

## 2024-06-02 DIAGNOSIS — Z6836 Body mass index (BMI) 36.0-36.9, adult: Secondary | ICD-10-CM | POA: Diagnosis not present

## 2024-06-02 DIAGNOSIS — F5221 Male erectile disorder: Secondary | ICD-10-CM | POA: Diagnosis not present

## 2024-06-02 DIAGNOSIS — I10 Essential (primary) hypertension: Secondary | ICD-10-CM | POA: Diagnosis not present

## 2024-06-02 DIAGNOSIS — Z7901 Long term (current) use of anticoagulants: Secondary | ICD-10-CM | POA: Diagnosis not present

## 2024-06-02 DIAGNOSIS — Z13220 Encounter for screening for lipoid disorders: Secondary | ICD-10-CM | POA: Diagnosis not present

## 2024-06-02 DIAGNOSIS — Z0000 Encounter for general adult medical examination without abnormal findings: Secondary | ICD-10-CM | POA: Diagnosis not present

## 2024-06-02 DIAGNOSIS — Z0001 Encounter for general adult medical examination with abnormal findings: Secondary | ICD-10-CM | POA: Diagnosis not present

## 2024-06-02 DIAGNOSIS — I4891 Unspecified atrial fibrillation: Secondary | ICD-10-CM | POA: Diagnosis not present

## 2024-06-02 DIAGNOSIS — Z1329 Encounter for screening for other suspected endocrine disorder: Secondary | ICD-10-CM | POA: Diagnosis not present
